# Patient Record
Sex: Female | Born: 1985 | Race: White | Hispanic: No | Marital: Married | Smoking: Former smoker
Health system: Southern US, Community
[De-identification: ages and names within clinical notes are randomized; demographics above are authoritative.]

## PROBLEM LIST (undated history)

## (undated) ENCOUNTER — Inpatient Hospital Stay (HOSPITAL_COMMUNITY): Payer: Self-pay

## (undated) DIAGNOSIS — Z789 Other specified health status: Secondary | ICD-10-CM

## (undated) DIAGNOSIS — N200 Calculus of kidney: Secondary | ICD-10-CM

## (undated) DIAGNOSIS — Z8619 Personal history of other infectious and parasitic diseases: Secondary | ICD-10-CM

## (undated) DIAGNOSIS — M419 Scoliosis, unspecified: Secondary | ICD-10-CM

## (undated) HISTORY — DX: Calculus of kidney: N20.0

## (undated) HISTORY — DX: Personal history of other infectious and parasitic diseases: Z86.19

---

## 2009-08-12 HISTORY — PX: CHOLECYSTECTOMY: SHX55

## 2009-11-02 ENCOUNTER — Inpatient Hospital Stay (HOSPITAL_COMMUNITY): Admission: AD | Admit: 2009-11-02 | Discharge: 2009-11-04 | Payer: Self-pay | Admitting: Obstetrics & Gynecology

## 2010-01-09 ENCOUNTER — Ambulatory Visit (HOSPITAL_COMMUNITY): Admission: EM | Admit: 2010-01-09 | Discharge: 2010-01-11 | Payer: Self-pay | Admitting: Emergency Medicine

## 2010-01-10 ENCOUNTER — Encounter (INDEPENDENT_AMBULATORY_CARE_PROVIDER_SITE_OTHER): Payer: Self-pay

## 2010-10-29 LAB — CBC
HCT: 36.4 % (ref 36.0–46.0)
HCT: 39.5 % (ref 36.0–46.0)
Hemoglobin: 11.4 g/dL — ABNORMAL LOW (ref 12.0–15.0)
MCHC: 35.2 g/dL (ref 30.0–36.0)
MCV: 91.6 fL (ref 78.0–100.0)
Platelets: 145 10*3/uL — ABNORMAL LOW (ref 150–400)
Platelets: 171 10*3/uL (ref 150–400)
RBC: 3.55 MIL/uL — ABNORMAL LOW (ref 3.87–5.11)
RBC: 3.96 MIL/uL (ref 3.87–5.11)
RDW: 12.7 % (ref 11.5–15.5)
RDW: 12.7 % (ref 11.5–15.5)
WBC: 6.5 10*3/uL (ref 4.0–10.5)
WBC: 10.5 10*3/uL (ref 4.0–10.5)

## 2010-10-29 LAB — DIFFERENTIAL
Basophils Absolute: 0 10*3/uL (ref 0.0–0.1)
Blasts: 0 %
Eosinophils Absolute: 0 10*3/uL (ref 0.0–0.7)
Eosinophils Relative: 0 % (ref 0–5)
Lymphs Abs: 2.2 10*3/uL (ref 0.7–4.0)
Monocytes Absolute: 0.4 10*3/uL (ref 0.1–1.0)
Monocytes Relative: 4 % (ref 3–12)
Neutro Abs: 7.9 10*3/uL — ABNORMAL HIGH (ref 1.7–7.7)
Neutrophils Relative %: 75 % (ref 43–77)
Promyelocytes Absolute: 0 %

## 2010-10-29 LAB — COMPREHENSIVE METABOLIC PANEL
ALT: 15 U/L (ref 0–35)
Alkaline Phosphatase: 60 U/L (ref 39–117)
Alkaline Phosphatase: 65 U/L (ref 39–117)
BUN: 4 mg/dL — ABNORMAL LOW (ref 6–23)
BUN: 4 mg/dL — ABNORMAL LOW (ref 6–23)
CO2: 26 mEq/L (ref 19–32)
Chloride: 109 mEq/L (ref 96–112)
Creatinine, Ser: 0.74 mg/dL (ref 0.4–1.2)
Creatinine, Ser: 0.88 mg/dL (ref 0.4–1.2)
Creatinine, Ser: 0.71 mg/dL (ref 0.4–1.2)
GFR calc Af Amer: >60 mL/min (ref >60)
Glucose, Bld: 105 mg/dL — ABNORMAL HIGH (ref 70–99)
Glucose, Bld: 109 mg/dL — ABNORMAL HIGH (ref 70–99)
Glucose, Bld: 108 mg/dL — ABNORMAL HIGH (ref 70–99)
Potassium: 3 mEq/L — ABNORMAL LOW (ref 3.5–5.1)
Total Bilirubin: 0.9 mg/dL (ref 0.3–1.2)
Total Protein: 7.2 g/dL (ref 6.0–8.3)

## 2010-10-29 LAB — URINALYSIS, ROUTINE W REFLEX MICROSCOPIC
Bilirubin Urine: NEGATIVE
Glucose, UA: NEGATIVE mg/dL
Hgb urine dipstick: NEGATIVE
Ketones, ur: NEGATIVE mg/dL
Nitrite: NEGATIVE
Urobilinogen, UA: 0.2 mg/dL (ref 0.0–1.0)

## 2010-11-05 LAB — CBC
HCT: 30.2 % — ABNORMAL LOW (ref 36.0–46.0)
HCT: 38.4 % (ref 36.0–46.0)
Hemoglobin: 10.6 g/dL — ABNORMAL LOW (ref 12.0–15.0)
MCV: 95.4 fL (ref 78.0–100.0)
Platelets: 162 10*3/uL (ref 150–400)
Platelets: 183 10*3/uL (ref 150–400)
RDW: 13.7 % (ref 11.5–15.5)
RDW: 13.8 % (ref 11.5–15.5)
WBC: 8.9 10*3/uL (ref 4.0–10.5)

## 2010-11-05 LAB — RPR: RPR Ser Ql: NONREACTIVE

## 2012-08-12 NOTE — L&D Delivery Note (Signed)
Patient was C/C/+3 and pushed for 10 minutes with epidural.   NSVD  female infant, Apgars 9,9, weight P.   The patient had one midline first degree lacerations repaired with 2-0 vicryl R. Fundus was firm. EBL was expected. Placenta was delivered intact. Vagina was clear.  Baby was vigorous to bedside.  Kelsey [name] A

## 2012-09-22 ENCOUNTER — Other Ambulatory Visit: Payer: Self-pay | Admitting: Obstetrics and Gynecology

## 2012-09-22 LAB — OB RESULTS CONSOLE GC/CHLAMYDIA
Chlamydia: NEGATIVE
Gonorrhea: NEGATIVE

## 2012-11-02 LAB — OB RESULTS CONSOLE RPR: RPR: NONREACTIVE

## 2012-11-02 LAB — OB RESULTS CONSOLE ANTIBODY SCREEN: Antibody Screen: NEGATIVE

## 2013-04-22 ENCOUNTER — Inpatient Hospital Stay (HOSPITAL_COMMUNITY)
Admission: AD | Admit: 2013-04-22 | Discharge: 2013-04-25 | DRG: 775 | Disposition: A | Discharge status: Home / Self Care | Payer: Managed Care, Other (non HMO) | Source: Ambulatory Visit | Attending: Obstetrics and Gynecology | Admitting: Obstetrics and Gynecology

## 2013-04-22 ENCOUNTER — Encounter (HOSPITAL_COMMUNITY): Payer: Self-pay | Admitting: *Deleted

## 2013-04-22 DIAGNOSIS — Z3480 Encounter for supervision of other normal pregnancy, unspecified trimester: Secondary | ICD-10-CM

## 2013-04-22 DIAGNOSIS — Z9089 Acquired absence of other organs: Secondary | ICD-10-CM

## 2013-04-22 HISTORY — DX: Other specified health status: Z78.9

## 2013-04-22 LAB — CBC
HCT: 32.8 % — ABNORMAL LOW (ref 36.0–46.0)
MCV: 89.9 fL (ref 78.0–100.0)
Platelets: 158 10*3/uL (ref 150–400)
RBC: 3.65 MIL/uL — ABNORMAL LOW (ref 3.87–5.11)
WBC: 8.5 10*3/uL (ref 4.0–10.5)

## 2013-04-22 LAB — POCT FERN TEST: POCT Fern Test: POSITIVE

## 2013-04-22 MED ORDER — OXYTOCIN 40 UNITS IN LACTATED RINGERS INFUSION - SIMPLE MED
62.5000 mL/h | INTRAVENOUS | Status: DC
  Filled 2013-04-22: qty 1000

## 2013-04-22 MED ORDER — DIPHENHYDRAMINE HCL 50 MG/ML IJ SOLN: 12.5000 mg | INTRAMUSCULAR | Status: DC | PRN

## 2013-04-22 MED ORDER — CITRIC ACID-SODIUM CITRATE 334-500 MG/5ML PO SOLN: 30.0000 mL | ORAL | Status: DC | PRN

## 2013-04-22 MED ORDER — IBUPROFEN 600 MG PO TABS: 600.0000 mg | ORAL_TABLET | Freq: Four times a day (QID) | ORAL | Status: DC | PRN

## 2013-04-22 MED ORDER — OXYCODONE-ACETAMINOPHEN 5-325 MG PO TABS: 1.0000 | ORAL_TABLET | ORAL | Status: DC | PRN

## 2013-04-22 MED ORDER — EPHEDRINE 5 MG/ML INJ
10.0000 mg | INTRAVENOUS | Status: DC | PRN
  Filled 2013-04-22: qty 2

## 2013-04-22 MED ORDER — EPHEDRINE 5 MG/ML INJ
10.0000 mg | INTRAVENOUS | Status: DC | PRN
  Filled 2013-04-22: qty 2
  Filled 2013-04-22: qty 4

## 2013-04-22 MED ORDER — FLEET ENEMA 7-19 GM/118ML RE ENEM: 1.0000 | ENEMA | RECTAL | Status: DC | PRN

## 2013-04-22 MED ORDER — PHENYLEPHRINE 40 MCG/ML (10ML) SYRINGE FOR IV PUSH (FOR BLOOD PRESSURE SUPPORT)
80.0000 ug | PREFILLED_SYRINGE | INTRAVENOUS | Status: DC | PRN
  Filled 2013-04-22: qty 2

## 2013-04-22 MED ORDER — LACTATED RINGERS IV SOLN: 500.0000 mL | INTRAVENOUS | Status: DC | PRN

## 2013-04-22 MED ORDER — LACTATED RINGERS IV SOLN
INTRAVENOUS | Status: DC
  Administered 2013-04-22: 21:00:00 via INTRAVENOUS

## 2013-04-22 MED ORDER — LIDOCAINE HCL (PF) 1 % IJ SOLN
30.0000 mL | INTRAMUSCULAR | Status: DC | PRN
  Filled 2013-04-22 (×2): qty 30

## 2013-04-22 MED ORDER — LACTATED RINGERS IV SOLN
500.0000 mL | Freq: Once | INTRAVENOUS | Status: AC
  Administered 2013-04-23: 500 mL via INTRAVENOUS

## 2013-04-22 MED ORDER — FENTANYL 2.5 MCG/ML BUPIVACAINE 1/10 % EPIDURAL INFUSION (WH - ANES)
14.0000 mL/h | INTRAMUSCULAR | Status: DC | PRN
  Filled 2013-04-22: qty 125

## 2013-04-22 MED ORDER — ACETAMINOPHEN 325 MG PO TABS: 650.0000 mg | ORAL_TABLET | ORAL | Status: DC | PRN

## 2013-04-22 MED ORDER — ONDANSETRON HCL 4 MG/2ML IJ SOLN: 4.0000 mg | Freq: Four times a day (QID) | INTRAMUSCULAR | Status: DC | PRN

## 2013-04-22 MED ORDER — PHENYLEPHRINE 40 MCG/ML (10ML) SYRINGE FOR IV PUSH (FOR BLOOD PRESSURE SUPPORT)
80.0000 ug | PREFILLED_SYRINGE | INTRAVENOUS | Status: DC | PRN
  Filled 2013-04-22: qty 5
  Filled 2013-04-22: qty 2

## 2013-04-22 MED ORDER — OXYTOCIN BOLUS FROM INFUSION
500.0000 mL | INTRAVENOUS | Status: DC
  Administered 2013-04-23: 500 mL via INTRAVENOUS

## 2013-04-22 MED ORDER — BUTORPHANOL TARTRATE 1 MG/ML IJ SOLN: 1.0000 mg | INTRAMUSCULAR | Status: DC | PRN

## 2013-04-22 NOTE — H&P (Signed)
27 y.o. [redacted]w[redacted]d  G3P1011 comes in c/o SROM at 1900.  Otherwise has good fetal movement and no bleeding.  Past Medical History  Diagnosis Date  . Medical history non-contributory     Past Surgical History  Procedure Laterality Date  . Cholecystectomy      OB History  Gravida Para Term Preterm AB SAB TAB Ectopic Multiple Living  3 1 1  1  1   1     # Outcome Date GA Lbr Len/2nd Weight Sex Delivery Anes PTL Lv  3 CUR           2 TRM      SVD   Y  1 TAB               History   Social History  . Marital Status: Married    Spouse Name: N/[name]    Number of Children: N/[name]  . Years of Education: N/[name]   Occupational History  . Not on file.   Social History Main Topics  . Smoking status: Former Smoker    Types: Cigarettes    Quit date: 08/13/2000  . Smokeless tobacco: Not on file  . Alcohol Use: No  . Drug Use: No  . Sexual Activity: Yes   Other Topics Concern  . Not on file   Social History Narrative  . No narrative on file   Review of patient's allergies indicates no known allergies.    Prenatal Transfer Tool  Maternal Diabetes: No Genetic Screening: Normal Maternal Ultrasounds/Referrals: Normal Fetal Ultrasounds or other Referrals:  None Maternal Substance Abuse:  No Significant Maternal Medications:  None Significant Maternal Lab Results: None  Other PNC:    Filed Vitals:   04/22/13 1956  BP: 124/86  Pulse: 86  Temp: 98.3 F (36.8 C)  Resp: 18     Lungs/Cor:  NAD Abdomen:  soft, gravid Ex:  no cords, erythema SVE:  4/90/-2, grossly ruptured per nurse FHTs:  120, good STV, NST R Toco:  q5   [name]/P   Term, SROM, labor.  GBS neg.  Kelsey [name]

## 2013-04-22 NOTE — MAU Note (Signed)
PT SAYS AT 7PM  SHE WAS  SQUATTING - CLEANING-  AND SHE FELT A GUSH-  CLEAR FLUID.   VE IN OFFICE  4 CM  YESTERDAY.    GBS- NEG..  DENIES HSV AND MRSA.

## 2013-04-23 ENCOUNTER — Encounter (HOSPITAL_COMMUNITY): Payer: Self-pay | Admitting: Anesthesiology

## 2013-04-23 ENCOUNTER — Inpatient Hospital Stay (HOSPITAL_COMMUNITY): Payer: Managed Care, Other (non HMO) | Admitting: Anesthesiology

## 2013-04-23 LAB — CBC
HCT: 31 % — ABNORMAL LOW (ref 36.0–46.0)
Hemoglobin: 10.8 g/dL — ABNORMAL LOW (ref 12.0–15.0)
MCH: 31.5 pg (ref 26.0–34.0)
MCHC: 34.8 g/dL (ref 30.0–36.0)

## 2013-04-23 MED ORDER — SIMETHICONE 80 MG PO CHEW: 80.0000 mg | CHEWABLE_TABLET | ORAL | Status: DC | PRN

## 2013-04-23 MED ORDER — MAGNESIUM HYDROXIDE 400 MG/5ML PO SUSP: 30.0000 mL | ORAL | Status: DC | PRN

## 2013-04-23 MED ORDER — BENZOCAINE-MENTHOL 20-0.5 % EX AERO: 1.0000 "application " | INHALATION_SPRAY | CUTANEOUS | Status: DC | PRN

## 2013-04-23 MED ORDER — OXYCODONE-ACETAMINOPHEN 5-325 MG PO TABS
1.0000 | ORAL_TABLET | ORAL | Status: DC | PRN
  Administered 2013-04-23 (×2): 1 via ORAL
  Filled 2013-04-23 (×2): qty 1

## 2013-04-23 MED ORDER — SODIUM CHLORIDE 0.9 % IV SOLN: 250.0000 mL | INTRAVENOUS | Status: DC | PRN

## 2013-04-23 MED ORDER — WITCH HAZEL-GLYCERIN EX PADS: 1.0000 "application " | MEDICATED_PAD | CUTANEOUS | Status: DC | PRN

## 2013-04-23 MED ORDER — IBUPROFEN 800 MG PO TABS
800.0000 mg | ORAL_TABLET | Freq: Three times a day (TID) | ORAL | Status: DC
  Administered 2013-04-23 – 2013-04-25 (×7): 800 mg via ORAL
  Filled 2013-04-23 (×7): qty 1

## 2013-04-23 MED ORDER — SODIUM CHLORIDE 0.9 % IJ SOLN
3.0000 mL | Freq: Two times a day (BID) | INTRAMUSCULAR | Status: DC
  Administered 2013-04-23: 3 mL via INTRAVENOUS

## 2013-04-23 MED ORDER — METHYLERGONOVINE MALEATE 0.2 MG/ML IJ SOLN: 0.2000 mg | INTRAMUSCULAR | Status: DC | PRN

## 2013-04-23 MED ORDER — DIBUCAINE 1 % RE OINT: 1.0000 "application " | TOPICAL_OINTMENT | RECTAL | Status: DC | PRN

## 2013-04-23 MED ORDER — TETANUS-DIPHTH-ACELL PERTUSSIS 5-2.5-18.5 LF-MCG/0.5 IM SUSP
0.5000 mL | Freq: Once | INTRAMUSCULAR | Status: AC
  Administered 2013-04-24: 0.5 mL via INTRAMUSCULAR
  Filled 2013-04-23: qty 0.5

## 2013-04-23 MED ORDER — METHYLERGONOVINE MALEATE 0.2 MG PO TABS: 0.2000 mg | ORAL_TABLET | ORAL | Status: DC | PRN

## 2013-04-23 MED ORDER — SODIUM CHLORIDE 0.9 % IJ SOLN: 3.0000 mL | INTRAMUSCULAR | Status: DC | PRN

## 2013-04-23 MED ORDER — LIDOCAINE HCL (PF) 1 % IJ SOLN
INTRAMUSCULAR | Status: DC | PRN
  Administered 2013-04-23 (×2): 9 mL

## 2013-04-23 MED ORDER — ONDANSETRON HCL 4 MG/2ML IJ SOLN: 4.0000 mg | INTRAMUSCULAR | Status: DC | PRN

## 2013-04-23 MED ORDER — MEASLES, MUMPS & RUBELLA VAC ~~LOC~~ INJ: 0.5000 mL | INJECTION | Freq: Once | SUBCUTANEOUS | Status: DC

## 2013-04-23 MED ORDER — FERROUS SULFATE 325 (65 FE) MG PO TABS
325.0000 mg | ORAL_TABLET | Freq: Two times a day (BID) | ORAL | Status: DC
  Administered 2013-04-23 – 2013-04-25 (×5): 325 mg via ORAL
  Filled 2013-04-23 (×5): qty 1

## 2013-04-23 MED ORDER — PRENATAL MULTIVITAMIN CH
1.0000 | ORAL_TABLET | Freq: Every day | ORAL | Status: DC
  Administered 2013-04-23 – 2013-04-25 (×3): 1 via ORAL
  Filled 2013-04-23 (×3): qty 1

## 2013-04-23 MED ORDER — LANOLIN HYDROUS EX OINT: TOPICAL_OINTMENT | CUTANEOUS | Status: DC | PRN

## 2013-04-23 MED ORDER — SENNOSIDES-DOCUSATE SODIUM 8.6-50 MG PO TABS: 2.0000 | ORAL_TABLET | Freq: Every day | ORAL | Status: DC

## 2013-04-23 MED ORDER — ONDANSETRON HCL 4 MG PO TABS: 4.0000 mg | ORAL_TABLET | ORAL | Status: DC | PRN

## 2013-04-23 MED ORDER — ZOLPIDEM TARTRATE 5 MG PO TABS: 5.0000 mg | ORAL_TABLET | Freq: Every evening | ORAL | Status: DC | PRN

## 2013-04-23 MED ORDER — FENTANYL 2.5 MCG/ML BUPIVACAINE 1/10 % EPIDURAL INFUSION (WH - ANES)
INTRAMUSCULAR | Status: DC | PRN
  Administered 2013-04-23: 14 mL/h via EPIDURAL

## 2013-04-23 MED ORDER — DIPHENHYDRAMINE HCL 25 MG PO CAPS: 25.0000 mg | ORAL_CAPSULE | Freq: Four times a day (QID) | ORAL | Status: DC | PRN

## 2013-04-23 NOTE — Anesthesia Preprocedure Evaluation (Signed)
Anesthesia Evaluation  Patient identified by MRN, date of birth, ID band Patient awake    Reviewed: Allergy & Precautions, H&P , NPO status , Patient's Chart, lab work & pertinent test results  Airway Mallampati: I TM Distance: >3 FB Neck ROM: full    Dental no notable dental hx.    Pulmonary neg pulmonary ROS,    Pulmonary exam normal       Cardiovascular negative cardio ROS      Neuro/Psych negative neurological ROS  negative psych ROS   GI/Hepatic negative GI ROS, Neg liver ROS,   Endo/Other  negative endocrine ROS  Renal/GU negative Renal ROS  negative genitourinary   Musculoskeletal negative musculoskeletal ROS (+)   Abdominal Normal abdominal exam  (+)   Peds  Hematology negative hematology ROS (+)   Anesthesia Other Findings   Reproductive/Obstetrics (+) Pregnancy                           Anesthesia Physical  Anesthesia Plan  ASA: II  Anesthesia Plan: Epidural   Post-op Pain Management:    Induction:   Airway Management Planned:   Additional Equipment:   Intra-op Plan:   Post-operative Plan:   Informed Consent: I have reviewed the patients History and Physical, chart, labs and discussed the procedure including the risks, benefits and alternatives for the proposed anesthesia with the patient or authorized representative who has indicated his/her understanding and acceptance.     Plan Discussed with:   Anesthesia Plan Comments:         Anesthesia Quick Evaluation  

## 2013-04-23 NOTE — Anesthesia Procedure Notes (Signed)
Epidural Patient location during procedure: OB Start time: 04/23/2013 1:01 AM End time: 04/23/2013 1:05 AM  Staffing Anesthesiologist: Sandrea Hughs Performed by: anesthesiologist   Preanesthetic Checklist Completed: patient identified, surgical consent, pre-op evaluation, timeout performed, IV checked, risks and benefits discussed and monitors and equipment checked  Epidural Patient position: sitting Prep: site prepped and draped and DuraPrep Patient monitoring: continuous pulse ox and blood pressure Approach: midline Injection technique: LOR air  Needle:  Needle type: Tuohy  Needle gauge: 17 G Needle length: 9 cm and 9 Needle insertion depth: 5 cm cm Catheter type: closed end flexible Catheter size: 19 Gauge Catheter at skin depth: 10 cm Test dose: negative and Other  Assessment Sensory level: T9 Events: blood not aspirated, injection not painful, no injection resistance, negative IV test and no paresthesia  Additional Notes Reason for block:procedure for pain

## 2013-04-23 NOTE — Progress Notes (Signed)
Dr. Henderson Cloud called, updated on pt status, SVE, and uc pattern, orders received if 2 hours following  Epidural placement still no cervical change then begin Pitocin at 2 mu/min and increase by 2 mu.min

## 2013-04-23 NOTE — Anesthesia Postprocedure Evaluation (Signed)
  Anesthesia Post-op Note  Patient: Kelsey [name]  Procedure(s) Performed: * No procedures listed *  Patient Location: Mother/Baby  Anesthesia Type:Epidural  Level of Consciousness: awake and alert   Airway and Oxygen Therapy: Patient Spontanous Breathing  Post-op Pain: mild  Post-op Assessment: Patient's Cardiovascular Status Stable, Respiratory Function Stable, No signs of Nausea or vomiting, Pain level controlled, No headache, No residual numbness and No residual motor weakness  Post-op Vital Signs: stable  Complications: No apparent anesthesia complications

## 2013-04-24 NOTE — Progress Notes (Signed)
PPD#1 Pt without complaints. VSSAF IMP/ doing well  Plan/ routine care. 

## 2013-04-24 NOTE — Lactation Note (Signed)
This note was copied from the chart of Kelsey [name]. Lactation Consultation Note Mom states breast feeding is going very well, denies breast pain, states baby latches very well to both sides. Mom states that she and baby are both doing fine with breast feeding.  Enc mom to call for assistance if she has any concerns. Mom states she does have the phone number for lactation office and will call if needed.  Follow up inpatient PRN.  Patient Name: Kelsey [name] [id number] Date: 04/24/2013     Maternal Data    Feeding Length of feed: 10 min  LATCH Score/Interventions Latch: Repeated attempts needed to sustain latch, nipple held in mouth throughout feeding, stimulation needed to elicit sucking reflex.  Audible Swallowing: A few with stimulation  Type of Nipple: Everted at rest and after stimulation  Comfort (Breast/Nipple): Soft / non-tender     Hold (Positioning): No assistance needed to correctly position infant at breast.  LATCH Score: 8  Lactation Tools Discussed/Used     Consult Status      Lenard Forth 04/24/2013, 3:29 PM

## 2013-04-25 NOTE — Discharge Summary (Signed)
Obstetric Discharge Summary Reason for Admission: onset of labor Prenatal Procedures: ultrasound Intrapartum Procedures: spontaneous vaginal delivery Postpartum Procedures: none Complications-Operative and Postpartum: 1 degree perineal laceration Hemoglobin  Date Value Range Status  04/23/2013 10.8* 12.0 - 15.0 g/dL Final     HCT  Date Value Range Status  04/23/2013 31.0* 36.0 - 46.0 % Final    Physical Exam:  General: alert Lochia: appropriate Uterine Fundus: firm  Discharge Diagnoses: Term Pregnancy-delivered  Discharge Information: Date: 04/25/2013 Activity: pelvic rest Diet: routine Medications: PNV and Ibuprofen Condition: stable Instructions: refer to practice specific booklet Discharge to: home Follow-up Information   Follow up with Levi Aland, MD. Schedule an appointment as soon as possible for a visit in 4 weeks.   Specialty:  Obstetrics and Gynecology   Contact information:   [address] [place] [address] [phone]       Newborn Data: Live born female  Birth Weight: 7 lb 4.1 oz (3290 g) APGAR: 9, 9  Home with mother.  ANDERSON,MARK E 04/25/2013, 9:47 AM

## 2013-04-25 NOTE — Progress Notes (Signed)
Pt without complaints. Ready to go home. PLAN/ Will discharge.

## 2014-03-08 ENCOUNTER — Inpatient Hospital Stay (HOSPITAL_COMMUNITY): Payer: Medicaid Other

## 2014-03-08 ENCOUNTER — Inpatient Hospital Stay (HOSPITAL_COMMUNITY)
Admission: AD | Admit: 2014-03-08 | Discharge: 2014-03-08 | Disposition: A | Discharge status: Home / Self Care | Payer: Medicaid Other | Source: Ambulatory Visit | Attending: Obstetrics and Gynecology | Admitting: Obstetrics and Gynecology

## 2014-03-08 ENCOUNTER — Encounter (HOSPITAL_COMMUNITY): Payer: Self-pay

## 2014-03-08 DIAGNOSIS — R109 Unspecified abdominal pain: Secondary | ICD-10-CM | POA: Diagnosis not present

## 2014-03-08 DIAGNOSIS — O209 Hemorrhage in early pregnancy, unspecified: Secondary | ICD-10-CM | POA: Insufficient documentation

## 2014-03-08 DIAGNOSIS — Z87891 Personal history of nicotine dependence: Secondary | ICD-10-CM | POA: Insufficient documentation

## 2014-03-08 DIAGNOSIS — O4690 Antepartum hemorrhage, unspecified, unspecified trimester: Secondary | ICD-10-CM

## 2014-03-08 LAB — CBC
HEMATOCRIT: 38.3 % (ref 36.0–46.0)
HEMOGLOBIN: 13.6 g/dL (ref 12.0–15.0)
MCH: 32.2 pg (ref 26.0–34.0)
MCHC: 35.5 g/dL (ref 30.0–36.0)
MCV: 90.8 fL (ref 78.0–100.0)
Platelets: 226 10*3/uL (ref 150–400)
RBC: 4.22 MIL/uL (ref 3.87–5.11)
RDW: 12.2 % (ref 11.5–15.5)
WBC: 10.3 10*3/uL (ref 4.0–10.5)

## 2014-03-08 LAB — HCG, QUANTITATIVE, PREGNANCY: HCG, BETA CHAIN, QUANT, S: 8530 m[IU]/mL — AB (ref <5)

## 2014-03-08 NOTE — MAU Note (Signed)
Pt presenting with heavier bleeding after having some spotting 3 days ago and then bleeding becoming progressively heavier. Pt changing a pad every hour for today.

## 2014-03-08 NOTE — MAU Note (Signed)
Patient states she has had a positive home pregnancy test. Started spotting 4 days ago after intercourse but has continued and is heavier. Patient has blood on outer clothing and in the bathroom is bleeding moderately dark red and passing clots. Assisted with clean up and clothing change.

## 2014-03-08 NOTE — MAU Provider Note (Signed)
History     CSN: [id number]  Arrival date and time: 03/08/14 1740   First Provider Initiated Contact with Patient 03/08/14 1843      Chief Complaint  Patient presents with  . Possible Pregnancy  . Vaginal Bleeding   HPI Pt is 1268w5d pregnant [id number] who presents with heavy bleeding and cramping since 4 pm.  Pt has passed large clots. Pt has a 5710 month old delivered by Dr. Henderson CloudHorvath RN note: Collison, RN Registered Nurse Signed  MAU Note Service date: 03/08/2014 6:33 PM   Pt presenting with heavier bleeding after having some spotting 3 days ago and then bleeding becoming progressively heavier. Pt changing a pad every hour for today  Patient states she has had a positive home pregnancy test. Started spotting 4 days ago after intercourse but has continued and is heavier. Patient has blood on outer clothing and in the bathroom is bleeding moderately dark red and passing clots. Assisted with clean up and clothing change   Past Medical History  Diagnosis Date  . Medical history non-contributory     Past Surgical History  Procedure Laterality Date  . Cholecystectomy      History reviewed. No pertinent family history.  History  Substance Use Topics  . Smoking status: Former Smoker    Types: Cigarettes    Quit date: 08/13/2000  . Smokeless tobacco: Not on file  . Alcohol Use: No    Allergies: No Known Allergies  No prescriptions prior to admission    Review of Systems  Constitutional: Negative for fever and chills.  Gastrointestinal: Positive for abdominal pain.  Genitourinary: Negative for dysuria.   Physical Exam   Blood pressure 139/84, pulse 102, temperature 99.8 F (37.7 C), temperature source Oral, resp. rate 16, height 4' 11.5" (1.511 m), weight 156 lb 6.4 oz (70.943 kg), last menstrual period 12/23/2013, SpO2 98.00%, unknown if currently breastfeeding.  Physical Exam  Nursing note and vitals reviewed. Constitutional: She is oriented to person, place, and  time. She appears well-developed and well-nourished.  tearful  HENT:  Head: Normocephalic.  Eyes: Pupils are equal, round, and reactive to light.  Neck: Normal range of motion. Neck supple.  Cardiovascular: Normal rate.   Respiratory: Effort normal.  GI: Soft. She exhibits no distension. There is no tenderness. There is no rebound.  Genitourinary:  Mod amount of dark red blood in vault; no clots or tissue noted; cervix ?FT; uterus and adnexa without palpable tenderness  Musculoskeletal: Normal range of motion.  Neurological: She is alert and oriented to person, place, and time.  Skin: Skin is warm and dry.  Psychiatric: She has a normal mood and affect.    MAU Course  Procedures Dr. Henderson CloudHorvath came to see pt and review ultrasound [place] Ob Comp Less 14 Wks  03/08/2014   CLINICAL DATA:  Patient with vaginal bleeding.  EXAM: OBSTETRIC <14 WK US AND TRANSVAGINAL OB US  TECHNIQUE: Both transabdominal and transvaginal ultrasound examinations were performed for complete evaluation of the gestation as well as the maternal uterus, adnexal regions, and pelvic cul-de-sac. Transvaginal technique was performed to assess early pregnancy.  COMPARISON:  None.  FINDINGS: Intrauterine gestational sac: Visualized/normal in shape.  Yolk sac:  Present  Embryo:  Present  Cardiac Activity: Present  Heart Rate:  115 bpm  CRL:   7.3  mm   6 w 5 d                  US EDC: 10/27/2014  Maternal uterus/adnexae: The  ovaries are unremarkable. Probable right ovarian corpus luteal cyst.  IMPRESSION: Single live intrauterine gestation 6 weeks 5 days by crown-rump length.   Electronically Signed   By: Annia Belt M.D.   On: 03/08/2014 19:47   US Ob Transvaginal  03/08/2014   CLINICAL DATA:  Patient with vaginal bleeding.  EXAM: OBSTETRIC <14 WK [place] AND TRANSVAGINAL OB US  TECHNIQUE: Both transabdominal and transvaginal ultrasound examinations were performed for complete evaluation of the gestation as well as the maternal uterus,  adnexal regions, and pelvic cul-de-sac. Transvaginal technique was performed to assess early pregnancy.  COMPARISON:  None.  FINDINGS: Intrauterine gestational sac: Visualized/normal in shape.  Yolk sac:  Present  Embryo:  Present  Cardiac Activity: Present  Heart Rate:  115 bpm  CRL:   7.3  mm   6 w 5 d                  [place] EDC: 10/27/2014  Maternal uterus/adnexae: The ovaries are unremarkable. Probable right ovarian corpus luteal cyst.  IMPRESSION: Single live intrauterine gestation 6 weeks 5 days by crown-rump length.   Electronically Signed   By: Annia Belt M.D.   On: 03/08/2014 19:47   Results for orders placed during the hospital encounter of 03/08/14 (from the past 24 hour(s))  CBC     Status: None   Collection Time    03/08/14  6:36 PM      Result Value Ref Range   WBC 10.3  4.0 - 10.5 K/uL   RBC 4.22  3.87 - 5.11 MIL/uL   Hemoglobin 13.6  12.0 - 15.0 g/dL   HCT 16.1  09.6 - 04.5 %   MCV 90.8  78.0 - 100.0 fL   MCH 32.2  26.0 - 34.0 pg   MCHC 35.5  30.0 - 36.0 g/dL   RDW 40.9  81.1 - 91.4 %   Platelets 226  150 - 400 K/uL  HCG, QUANTITATIVE, PREGNANCY     Status: Abnormal   Collection Time    03/08/14  6:36 PM      Result Value Ref Range   hCG, Beta Chain, Quant, S 8530 (*) <5 mIU/mL    Assessment and Plan  Bleeding in pregnancy D/c home and follow up in one week at office for ultrasound  Kelsey [name],Kelsey [name] 03/08/2014, 7:01 PM

## 2014-06-13 ENCOUNTER — Encounter (HOSPITAL_COMMUNITY): Payer: Self-pay

## 2014-08-12 NOTE — L&D Delivery Note (Addendum)
Delivery Note At 3:01 PM a viable female, "Kelsey [name]", was delivered via Vaginal, Spontaneous Delivery (Presentation: Right Occiput Anterior).  APGAR: 9, 9; weight  .   Placenta status: Intact, Spontaneous.  Cord: 3 vessels with the following complications: CAN x 2, reduced over body at delivery.  Cord pH: NA  Anesthesia: Epidural  Episiotomy: None Lacerations: None--scattered perineal abrasions, none requiring repair. Suture Repair: None Est. Blood Loss (mL): 15 cc  Mom to postpartum.  Baby to Couplet care / Skin to Skin. Patient desires PP BTL. Will make NPO after MN, maintain epidural and IV access. Dr. Richardson Dopp notified of patient request for PP BTL. Consent signed 02/15/15, placed on shadow chart.  Kelsey [name] 05/13/2015, 3:26 PM

## 2014-12-22 LAB — OB RESULTS CONSOLE HIV ANTIBODY (ROUTINE TESTING): HIV: NONREACTIVE

## 2014-12-22 LAB — OB RESULTS CONSOLE RPR: RPR: NONREACTIVE

## 2015-01-11 ENCOUNTER — Encounter (HOSPITAL_COMMUNITY): Payer: Self-pay | Admitting: *Deleted

## 2015-02-22 LAB — OB RESULTS CONSOLE RPR: RPR: NONREACTIVE

## 2015-02-22 LAB — OB RESULTS CONSOLE HIV ANTIBODY (ROUTINE TESTING): HIV: NONREACTIVE

## 2015-04-13 LAB — OB RESULTS CONSOLE GBS: GBS: NEGATIVE

## 2015-05-13 ENCOUNTER — Inpatient Hospital Stay (HOSPITAL_COMMUNITY): Payer: Medicaid Other | Admitting: Anesthesiology

## 2015-05-13 ENCOUNTER — Inpatient Hospital Stay (HOSPITAL_COMMUNITY)
Admission: AD | Admit: 2015-05-13 | Discharge: 2015-05-15 | DRG: 767 | Disposition: A | Discharge status: Home / Self Care | Payer: Medicaid Other | Source: Ambulatory Visit | Attending: Obstetrics and Gynecology | Admitting: Obstetrics and Gynecology

## 2015-05-13 ENCOUNTER — Encounter (HOSPITAL_COMMUNITY): Payer: Self-pay | Admitting: *Deleted

## 2015-05-13 DIAGNOSIS — K219 Gastro-esophageal reflux disease without esophagitis: Secondary | ICD-10-CM | POA: Diagnosis present

## 2015-05-13 DIAGNOSIS — O480 Post-term pregnancy: Principal | ICD-10-CM | POA: Diagnosis present

## 2015-05-13 DIAGNOSIS — O9962 Diseases of the digestive system complicating childbirth: Secondary | ICD-10-CM | POA: Diagnosis present

## 2015-05-13 DIAGNOSIS — O99214 Obesity complicating childbirth: Secondary | ICD-10-CM | POA: Diagnosis present

## 2015-05-13 DIAGNOSIS — O0930 Supervision of pregnancy with insufficient antenatal care, unspecified trimester: Secondary | ICD-10-CM | POA: Diagnosis not present

## 2015-05-13 DIAGNOSIS — Z302 Encounter for sterilization: Secondary | ICD-10-CM | POA: Diagnosis not present

## 2015-05-13 DIAGNOSIS — Z3A40 40 weeks gestation of pregnancy: Secondary | ICD-10-CM | POA: Diagnosis not present

## 2015-05-13 DIAGNOSIS — E669 Obesity, unspecified: Secondary | ICD-10-CM | POA: Diagnosis present

## 2015-05-13 DIAGNOSIS — Z818 Family history of other mental and behavioral disorders: Secondary | ICD-10-CM | POA: Diagnosis not present

## 2015-05-13 DIAGNOSIS — Z87891 Personal history of nicotine dependence: Secondary | ICD-10-CM

## 2015-05-13 DIAGNOSIS — Z85850 Personal history of malignant neoplasm of thyroid: Secondary | ICD-10-CM | POA: Diagnosis not present

## 2015-05-13 DIAGNOSIS — Z6831 Body mass index (BMI) 31.0-31.9, adult: Secondary | ICD-10-CM | POA: Diagnosis not present

## 2015-05-13 DIAGNOSIS — Z820 Family history of epilepsy and other diseases of the nervous system: Secondary | ICD-10-CM | POA: Diagnosis not present

## 2015-05-13 DIAGNOSIS — O9902 Anemia complicating childbirth: Secondary | ICD-10-CM | POA: Diagnosis present

## 2015-05-13 HISTORY — DX: Scoliosis, unspecified: M41.9

## 2015-05-13 LAB — TYPE AND SCREEN
ABO/RH(D): A POS
ANTIBODY SCREEN: NEGATIVE

## 2015-05-13 LAB — CBC
HEMATOCRIT: 32.6 % — AB (ref 36.0–46.0)
HEMOGLOBIN: 11.2 g/dL — AB (ref 12.0–15.0)
MCH: 31.3 pg (ref 26.0–34.0)
MCHC: 34.4 g/dL (ref 30.0–36.0)
MCV: 91.1 fL (ref 78.0–100.0)
Platelets: 154 10*3/uL (ref 150–400)
RBC: 3.58 MIL/uL — ABNORMAL LOW (ref 3.87–5.11)
RDW: 13.7 % (ref 11.5–15.5)
WBC: 7.1 10*3/uL (ref 4.0–10.5)

## 2015-05-13 LAB — RPR: RPR Ser Ql: NONREACTIVE

## 2015-05-13 LAB — ABO/RH: ABO/RH(D): A POS

## 2015-05-13 MED ORDER — OXYCODONE-ACETAMINOPHEN 5-325 MG PO TABS: 2.0000 | ORAL_TABLET | ORAL | Status: DC | PRN

## 2015-05-13 MED ORDER — EPHEDRINE 5 MG/ML INJ: 10.0000 mg | INTRAVENOUS | Status: DC | PRN

## 2015-05-13 MED ORDER — FENTANYL 2.5 MCG/ML BUPIVACAINE 1/10 % EPIDURAL INFUSION (WH - ANES)
14.0000 mL/h | INTRAMUSCULAR | Status: DC | PRN
  Administered 2015-05-13: 14 mL/h via EPIDURAL
  Administered 2015-05-13: 11 mL/h via EPIDURAL
  Filled 2015-05-13: qty 125

## 2015-05-13 MED ORDER — OXYCODONE-ACETAMINOPHEN 5-325 MG PO TABS
1.0000 | ORAL_TABLET | ORAL | Status: DC | PRN
  Administered 2015-05-15 (×3): 1 via ORAL
  Filled 2015-05-13 (×3): qty 1

## 2015-05-13 MED ORDER — OXYTOCIN BOLUS FROM INFUSION
500.0000 mL | INTRAVENOUS | Status: DC
  Administered 2015-05-13: 500 mL via INTRAVENOUS

## 2015-05-13 MED ORDER — CITRIC ACID-SODIUM CITRATE 334-500 MG/5ML PO SOLN: 30.0000 mL | ORAL | Status: DC | PRN

## 2015-05-13 MED ORDER — ACETAMINOPHEN 325 MG PO TABS: 650.0000 mg | ORAL_TABLET | ORAL | Status: DC | PRN

## 2015-05-13 MED ORDER — OXYTOCIN 40 UNITS IN LACTATED RINGERS INFUSION - SIMPLE MED
62.5000 mL/h | INTRAVENOUS | Status: DC
  Filled 2015-05-13: qty 1000

## 2015-05-13 MED ORDER — ONDANSETRON HCL 4 MG/2ML IJ SOLN: 4.0000 mg | INTRAMUSCULAR | Status: DC | PRN

## 2015-05-13 MED ORDER — OXYCODONE-ACETAMINOPHEN 5-325 MG PO TABS: 1.0000 | ORAL_TABLET | ORAL | Status: DC | PRN

## 2015-05-13 MED ORDER — DIPHENHYDRAMINE HCL 25 MG PO CAPS: 25.0000 mg | ORAL_CAPSULE | Freq: Four times a day (QID) | ORAL | Status: DC | PRN

## 2015-05-13 MED ORDER — SIMETHICONE 80 MG PO CHEW
80.0000 mg | CHEWABLE_TABLET | ORAL | Status: DC | PRN
  Administered 2015-05-14 – 2015-05-15 (×2): 80 mg via ORAL
  Filled 2015-05-13: qty 1

## 2015-05-13 MED ORDER — LIDOCAINE HCL (PF) 1 % IJ SOLN
30.0000 mL | INTRAMUSCULAR | Status: DC | PRN
  Filled 2015-05-13: qty 30

## 2015-05-13 MED ORDER — ZOLPIDEM TARTRATE 5 MG PO TABS: 5.0000 mg | ORAL_TABLET | Freq: Every evening | ORAL | Status: DC | PRN

## 2015-05-13 MED ORDER — ONDANSETRON HCL 4 MG/2ML IJ SOLN: 4.0000 mg | Freq: Four times a day (QID) | INTRAMUSCULAR | Status: DC | PRN

## 2015-05-13 MED ORDER — TETANUS-DIPHTH-ACELL PERTUSSIS 5-2.5-18.5 LF-MCG/0.5 IM SUSP: 0.5000 mL | Freq: Once | INTRAMUSCULAR | Status: DC

## 2015-05-13 MED ORDER — PRENATAL MULTIVITAMIN CH
1.0000 | ORAL_TABLET | Freq: Every day | ORAL | Status: DC
  Administered 2015-05-14 – 2015-05-15 (×2): 1 via ORAL
  Filled 2015-05-13 (×2): qty 1

## 2015-05-13 MED ORDER — ONDANSETRON HCL 4 MG PO TABS: 4.0000 mg | ORAL_TABLET | ORAL | Status: DC | PRN

## 2015-05-13 MED ORDER — SENNOSIDES-DOCUSATE SODIUM 8.6-50 MG PO TABS
2.0000 | ORAL_TABLET | ORAL | Status: DC
  Administered 2015-05-13 – 2015-05-14 (×2): 2 via ORAL
  Filled 2015-05-13: qty 2

## 2015-05-13 MED ORDER — LANOLIN HYDROUS EX OINT
TOPICAL_OINTMENT | CUTANEOUS | Status: DC | PRN
Start: 2015-05-13 — End: 2015-05-15

## 2015-05-13 MED ORDER — IBUPROFEN 600 MG PO TABS
600.0000 mg | ORAL_TABLET | Freq: Four times a day (QID) | ORAL | Status: DC
  Administered 2015-05-13 – 2015-05-15 (×8): 600 mg via ORAL
  Filled 2015-05-13 (×8): qty 1

## 2015-05-13 MED ORDER — LACTATED RINGERS IV SOLN
INTRAVENOUS | Status: DC
  Administered 2015-05-13 (×2): via INTRAVENOUS

## 2015-05-13 MED ORDER — PHENYLEPHRINE 40 MCG/ML (10ML) SYRINGE FOR IV PUSH (FOR BLOOD PRESSURE SUPPORT)
80.0000 ug | PREFILLED_SYRINGE | INTRAVENOUS | Status: DC | PRN
  Filled 2015-05-13: qty 20

## 2015-05-13 MED ORDER — DIPHENHYDRAMINE HCL 50 MG/ML IJ SOLN: 12.5000 mg | INTRAMUSCULAR | Status: DC | PRN

## 2015-05-13 MED ORDER — FLEET ENEMA 7-19 GM/118ML RE ENEM: 1.0000 | ENEMA | RECTAL | Status: DC | PRN

## 2015-05-13 MED ORDER — LIDOCAINE HCL (PF) 1 % IJ SOLN
INTRAMUSCULAR | Status: DC | PRN
  Administered 2015-05-13: 3 mL via EPIDURAL
  Administered 2015-05-13: 4 mL via EPIDURAL

## 2015-05-13 MED ORDER — LACTATED RINGERS IV SOLN
500.0000 mL | INTRAVENOUS | Status: DC | PRN
  Administered 2015-05-13: 500 mL via INTRAVENOUS

## 2015-05-13 MED ORDER — WITCH HAZEL-GLYCERIN EX PADS: 1.0000 "application " | MEDICATED_PAD | CUTANEOUS | Status: DC | PRN

## 2015-05-13 MED ORDER — BENZOCAINE-MENTHOL 20-0.5 % EX AERO: 1.0000 "application " | INHALATION_SPRAY | CUTANEOUS | Status: DC | PRN

## 2015-05-13 MED ORDER — DIBUCAINE 1 % RE OINT: 1.0000 "application " | TOPICAL_OINTMENT | RECTAL | Status: DC | PRN

## 2015-05-13 NOTE — Progress Notes (Signed)
  Subjective: Comfortable with epidural.  Noted she has signed consent PP BTL on 02/15/15.  Objective: BP 129/86 mmHg  Pulse 81  Temp(Src) 97.7 F (36.5 C) (Oral)  Resp 18  Ht  (1.499 m)  Wt 70.035 kg (154 lb 6.4 oz)  BMI 31.17 kg/m2       FHT: Category 1 UC:   regular, every 3 minutes SVE:   Dilation: 6 Effacement (%): 90 Station: -1 Exam by:: Manfred Arch, CNM   Assessment:  Progressive labor Desires BTL  Plan: Await increased pressure. BTL consent placed on shadow chart.  Nigel Bridgeman CNM 05/13/2015, 1:07 PM

## 2015-05-13 NOTE — Anesthesia Procedure Notes (Signed)
Epidural Patient location during procedure: OB Start time: 05/13/2015 11:46 AM  Staffing Anesthesiologist: Mal Amabile Performed by: anesthesiologist   Preanesthetic Checklist Completed: patient identified, site marked, surgical consent, pre-op evaluation, timeout performed, IV checked, risks and benefits discussed and monitors and equipment checked  Epidural Patient position: sitting Prep: site prepped and draped and DuraPrep Patient monitoring: continuous pulse ox and blood pressure Approach: midline Location: L3-L4 Injection technique: LOR air  Needle:  Needle type: Tuohy  Needle gauge: 17 G Needle length: 9 cm and 9 Needle insertion depth: 4 cm Catheter type: closed end flexible Catheter size: 19 Gauge Catheter at skin depth: 9 cm Test dose: negative and Other  Assessment Events: blood not aspirated, injection not painful, no injection resistance, negative IV test and no paresthesia  Additional Notes Patient identified. Risks and benefits discussed including failed block, incomplete  Pain control, post dural puncture headache, nerve damage, paralysis, blood pressure Changes, nausea, vomiting, reactions to medications-both toxic and allergic and post Partum back pain. All questions were answered. Patient expressed understanding and wished to proceed. Sterile technique was used throughout procedure. Epidural site was Dressed with sterile barrier dressing. No paresthesias, signs of intravascular injection Or signs of intrathecal spread were encountered.  Patient was more comfortable after the epidural was dosed. Please see RN's note for documentation of vital signs and FHR which are stable.

## 2015-05-13 NOTE — MAU Provider Note (Signed)
Kelsey [name] is a 29 y.o. female, G4P2012 at 40 3/7 weeks, presenting for SROM at 0730, irregular contractions since. Leaking clear fluid, denies bleeding, reports +FM. Cervix was 3 cm at office visit last week.  Patient Active Problem List   Diagnosis Date Noted  . Labor and delivery, indication for care 05/13/2015  . Late prenatal care 05/13/2015  . SGA (small for gestational age)--5%ile at 39 weeks. 05/13/2015    History of present pregnancy: Patient entered care at 20 6/7 weeks.  EDC of 05/10/15 was established by LMP  Anatomy scan: 20 6/7 weeks, with limited anatomy findings and an anterior placenta, EFW 20%ile. Additional US evaluations:  24 6/7 weeks: EFW 1+6, 7%ile, AFI WNL, completion of anatomy, 3.08 28 1/7 weeks: EFW 2+5, 13.8%ile, normal fluid, AC lag, cervix 3.38, anterior intramural fibroid, 3.6, x 2.6 x 3.0. 32 weeks: EFW 4+0, 20%ile, AFI 13.4, 40%ile, cervix 1.9, no funneling. AC 22%ile. 39 1/7 weeks: EFW 5+14, 5%ile, AFI 9.53, 20%ile, AC lag < 2%ile, vtx, normal cervical length. Significant prenatal events: Presented late to care at 20 6/7 weeks. Signed sterilization form 02/15/15. Followed growth during pregnancy, slight AC lag by 3rd trimester, growth at 5%ile at 39 weeks. Short cervix noted at 32 weeks.  Last evaluation: 05/11/15--cervix 3 cm, 50%ile, vtx, -2, BP 124/80, membranes swept.  OB History    Gravida Para Term Preterm AB TAB SAB Ectopic Multiple Living   5 2 2  1 1    2    2011--[facility], 40 weeks, 6 hour labor, 6+12, female, epidural, [facility] 2014--SVB, 40 weeks, 7 hour labor, 6+8, female, epidural, [facility] 2015--SAB at 8 weeks.  Past Medical History  Diagnosis Date  . Medical history non-contributory    Past Surgical History  Procedure Laterality Date  . Cholecystectomy     Family History: Mother MI; Brother thyroid cancer; MGM Parkinson's dx, depression  Social History:  reports that she  quit smoking about 14 years ago. Her smoking use included Cigarettes. She does not have any smokeless tobacco history on file. She reports that she does not drink alcohol or use illicit drugs. Patient is Caucasian,    Prenatal Transfer Tool  Maternal Diabetes: No Genetic Screening: Normal Quad screen Maternal Ultrasounds/Referrals: Normal--EFW 6+14, 5%ile at 39 weeks Fetal Ultrasounds or other Referrals: None Maternal Substance Abuse: No Significant Maternal Medications: None Significant Maternal Lab Results: Lab values include: Group B Strep negative  TDAP 02/22/15 Flu NA  ROS: Leaking clear fluid, irregular UCs, +FM  No Known Allergies   Dilation: 3 Effacement (%): 90, 80 Station: -2 Exam by:: Shimica Robinson RN Blood pressure 134/76, pulse 79, temperature 97.7 F (36.5 C), temperature source Oral, resp. rate 18, height 4' 11" (1.499 m), weight 70.035 kg (154 lb 6.4 oz), unknown if currently breastfeeding.  Chest clear Heart RRR without murmur Abd gravid, NT, FH 35 cm Pelvic: As above, leaking clear fluid Ext: WNL  FHR: Category 1 UCs: q 3-6 min, mild/mod  Prenatal labs: ABO, Rh: --/--/A POS (10/01 0824)A+ Antibody: NEG (10/01 0824)Neg Rubella:  Immune RPR:   NR HBsAg:  Neg  HIV:   NR GBS: Negative (09/01 0000)Negative 04/13/15 Sickle cell/Hgb electrophoresis: AA Pap: WNL 12/27/14 GC: Negative 12/22/14 Chlamydia: Negative 12/22/14 Genetic screenings: Normal Quad screen Glucola: WNL Other:  Hgb 12.5 at NOB, 12.4 at 28 weeks   Assessment/Plan: IUP at 40 3/7 weeks Early labor GBS negative Plans epidural EFW 5%ile on 05/04/15.  Plan: Admit to Birthing Suite per consult with Dr. Airika Alkhatib   weeks    Assessment/Plan: IUP at 40 3/7 weeks Early labor GBS negative Plans epidural EFW 5%ile on 05/04/15.  Plan: Admit to Birthing Suite per consult with Dr. Richardson Dopp Routine [facility] orders Pain med/epidural prn Will observe for advancement of labor at present, augment prn.  LATHAM, VICKICNM, MN 05/13/2015, 11:59 AM

## 2015-05-13 NOTE — Anesthesia Preprocedure Evaluation (Addendum)
Anesthesia Evaluation  Patient identified by MRN, date of birth, ID band Patient awake    Reviewed: Allergy & Precautions, NPO status , Patient's Chart, lab work & pertinent test results  Airway Mallampati: II  TM Distance: >3 FB Neck ROM: Full    Dental no notable dental hx. (+) Teeth Intact   Pulmonary former smoker,    Pulmonary exam normal breath sounds clear to auscultation       Cardiovascular negative cardio ROS Normal cardiovascular exam Rhythm:Regular Rate:Normal     Neuro/Psych negative neurological ROS  negative psych ROS   GI/Hepatic Neg liver ROS, GERD  ,  Endo/Other  Obesity  Renal/GU negative Renal ROS  negative genitourinary   Musculoskeletal Scoliosis   Abdominal (+) + obese,   Peds  Hematology  (+) anemia ,   Anesthesia Other Findings   Reproductive/Obstetrics (+) Pregnancy                            Anesthesia Physical Anesthesia Plan  ASA: II  Anesthesia Plan: Epidural   Post-op Pain Management:    Induction:   Airway Management Planned: Natural Airway  Additional Equipment:   Intra-op Plan:   Post-operative Plan:   Informed Consent: I have reviewed the patients History and Physical, chart, labs and discussed the procedure including the risks, benefits and alternatives for the proposed anesthesia with the patient or authorized representative who has indicated his/her understanding and acceptance.     Plan Discussed with: Anesthesiologist  Anesthesia Plan Comments:         Anesthesia Quick Evaluation

## 2015-05-14 ENCOUNTER — Encounter (HOSPITAL_COMMUNITY)
Admission: AD | Disposition: A | Discharge status: Home / Self Care | Payer: Self-pay | Source: Ambulatory Visit | Attending: Obstetrics and Gynecology

## 2015-05-14 ENCOUNTER — Inpatient Hospital Stay (HOSPITAL_COMMUNITY): Payer: Medicaid Other | Admitting: Anesthesiology

## 2015-05-14 ENCOUNTER — Encounter (HOSPITAL_COMMUNITY): Payer: Self-pay

## 2015-05-14 HISTORY — PX: TUBAL LIGATION: SHX77

## 2015-05-14 LAB — CBC
HCT: 30.7 % — ABNORMAL LOW (ref 36.0–46.0)
HEMOGLOBIN: 10.4 g/dL — AB (ref 12.0–15.0)
MCH: 31.5 pg (ref 26.0–34.0)
MCHC: 33.9 g/dL (ref 30.0–36.0)
MCV: 93 fL (ref 78.0–100.0)
PLATELETS: 143 10*3/uL — AB (ref 150–400)
RBC: 3.3 MIL/uL — ABNORMAL LOW (ref 3.87–5.11)
RDW: 13.8 % (ref 11.5–15.5)
WBC: 7.8 10*3/uL (ref 4.0–10.5)

## 2015-05-14 SURGERY — LIGATION, FALLOPIAN TUBE, POSTPARTUM
Anesthesia: Epidural | Site: Abdomen | Laterality: Bilateral

## 2015-05-14 MED ORDER — MIDAZOLAM HCL 2 MG/2ML IJ SOLN
INTRAMUSCULAR | Status: AC
  Filled 2015-05-14: qty 4

## 2015-05-14 MED ORDER — METOCLOPRAMIDE HCL 5 MG/ML IJ SOLN: 10.0000 mg | Freq: Once | INTRAMUSCULAR | Status: DC | PRN

## 2015-05-14 MED ORDER — MEPERIDINE HCL 25 MG/ML IJ SOLN: 6.2500 mg | INTRAMUSCULAR | Status: DC | PRN

## 2015-05-14 MED ORDER — FAMOTIDINE 20 MG PO TABS
40.0000 mg | ORAL_TABLET | Freq: Once | ORAL | Status: AC
  Administered 2015-05-14: 40 mg via ORAL
  Filled 2015-05-14: qty 2

## 2015-05-14 MED ORDER — FAMOTIDINE 20 MG PO TABS: 40.0000 mg | ORAL_TABLET | Freq: Once | ORAL | Status: DC

## 2015-05-14 MED ORDER — FENTANYL CITRATE (PF) 100 MCG/2ML IJ SOLN
INTRAMUSCULAR | Status: AC
Start: 2015-05-14 — End: 2015-05-14
  Filled 2015-05-14: qty 4

## 2015-05-14 MED ORDER — ONDANSETRON HCL 4 MG/2ML IJ SOLN
INTRAMUSCULAR | Status: AC
  Filled 2015-05-14: qty 2

## 2015-05-14 MED ORDER — PROPOFOL 10 MG/ML IV BOLUS
INTRAVENOUS | Status: DC | PRN
  Administered 2015-05-14: 10 mg via INTRAVENOUS
  Administered 2015-05-14: 5 mg via INTRAVENOUS
  Administered 2015-05-14 (×3): 10 mg via INTRAVENOUS

## 2015-05-14 MED ORDER — FENTANYL CITRATE (PF) 100 MCG/2ML IJ SOLN
INTRAMUSCULAR | Status: DC | PRN
  Administered 2015-05-14 (×4): 25 ug via INTRAVENOUS

## 2015-05-14 MED ORDER — ONDANSETRON HCL 4 MG/2ML IJ SOLN
INTRAMUSCULAR | Status: DC | PRN
  Administered 2015-05-14: 4 mg via INTRAVENOUS

## 2015-05-14 MED ORDER — FENTANYL CITRATE (PF) 100 MCG/2ML IJ SOLN: 25.0000 ug | INTRAMUSCULAR | Status: DC | PRN

## 2015-05-14 MED ORDER — LACTATED RINGERS IV SOLN
INTRAVENOUS | Status: DC | PRN
  Administered 2015-05-14 (×2): via INTRAVENOUS

## 2015-05-14 MED ORDER — BUPIVACAINE HCL (PF) 0.25 % IJ SOLN
INTRAMUSCULAR | Status: DC | PRN
  Administered 2015-05-14: 30 mL

## 2015-05-14 MED ORDER — MIDAZOLAM HCL 5 MG/5ML IJ SOLN
INTRAMUSCULAR | Status: DC | PRN
  Administered 2015-05-14 (×3): .5 mg via INTRAVENOUS
  Administered 2015-05-14: 1 mg via INTRAVENOUS
  Administered 2015-05-14: .5 mg via INTRAVENOUS

## 2015-05-14 MED ORDER — SODIUM BICARBONATE 8.4 % IV SOLN
INTRAVENOUS | Status: AC
Start: 2015-05-14 — End: 2015-05-14
  Filled 2015-05-14: qty 50

## 2015-05-14 MED ORDER — METOCLOPRAMIDE HCL 10 MG PO TABS: 10.0000 mg | ORAL_TABLET | Freq: Once | ORAL | Status: DC

## 2015-05-14 MED ORDER — SODIUM BICARBONATE 8.4 % IV SOLN
INTRAVENOUS | Status: DC | PRN
  Administered 2015-05-14 (×3): 5 mL via EPIDURAL

## 2015-05-14 MED ORDER — METOCLOPRAMIDE HCL 10 MG PO TABS
10.0000 mg | ORAL_TABLET | Freq: Once | ORAL | Status: AC
  Administered 2015-05-14: 10 mg via ORAL
  Filled 2015-05-14: qty 1

## 2015-05-14 MED ORDER — LIDOCAINE-EPINEPHRINE (PF) 2 %-1:200000 IJ SOLN
INTRAMUSCULAR | Status: AC
Start: 2015-05-14 — End: 2015-05-14
  Filled 2015-05-14: qty 20

## 2015-05-14 MED ORDER — LACTATED RINGERS IV SOLN: INTRAVENOUS | Status: DC

## 2015-05-14 SURGICAL SUPPLY — 23 items
CLOTH BEACON ORANGE TIMEOUT ST (SAFETY) ×2 IMPLANT
CONTAINER PREFILL 10% NBF 15ML (MISCELLANEOUS) ×4 IMPLANT
DRAPE INCISE 13X13 STRL (DRAPES) ×2 IMPLANT
DRSG OPSITE POSTOP 3X4 (GAUZE/BANDAGES/DRESSINGS) ×2 IMPLANT
ELECT REM PT RETURN 9FT ADLT (ELECTROSURGICAL) ×2
ELECTRODE REM PT RTRN 9FT ADLT (ELECTROSURGICAL) ×1 IMPLANT
GLOVE BIOGEL M 6.5 STRL (GLOVE) ×6 IMPLANT
GLOVE BIOGEL PI IND STRL 6.5 (GLOVE) ×1 IMPLANT
GLOVE BIOGEL PI INDICATOR 6.5 (GLOVE) ×1
GOWN STRL REUS W/TWL LRG LVL3 (GOWN DISPOSABLE) ×4 IMPLANT
LIQUID BAND (GAUZE/BANDAGES/DRESSINGS) ×2 IMPLANT
MARKER SKIN DUAL TIP RULER LAB (MISCELLANEOUS) ×2 IMPLANT
NEEDLE HYPO 22GX1.5 SAFETY (NEEDLE) ×2 IMPLANT
NS IRRIG 1000ML POUR BTL (IV SOLUTION) ×2 IMPLANT
PACK ABDOMINAL MINOR (CUSTOM PROCEDURE TRAY) ×2 IMPLANT
PENCIL BUTTON HOLSTER BLD 10FT (ELECTRODE) ×2 IMPLANT
SPONGE LAP 4X18 X RAY DECT (DISPOSABLE) IMPLANT
SUT VICRYL 0 UR6 27IN ABS (SUTURE) ×4 IMPLANT
SUT VICRYL 4-0 PS2 18IN ABS (SUTURE) ×2 IMPLANT
SYR CONTROL 10ML LL (SYRINGE) ×2 IMPLANT
TOWEL OR 17X24 6PK STRL BLUE (TOWEL DISPOSABLE) ×4 IMPLANT
TRAY FOLEY CATH SILVER 14FR (SET/KITS/TRAYS/PACK) ×2 IMPLANT
WATER STERILE IRR 1000ML POUR (IV SOLUTION) ×2 IMPLANT

## 2015-05-14 NOTE — Anesthesia Postprocedure Evaluation (Signed)
  Anesthesia Post-op Note  Patient: Kelsey [name]  Procedure(s) Performed: Labor and Delivery Epidural  Patient Location: Mother/Baby  Anesthesia Type:Epidural  Level of Consciousness: awake, alert  and oriented  Airway and Oxygen Therapy: Patient Spontanous Breathing  Post-op Pain: none  Post-op Assessment: Post-op Vital signs reviewed, Patient's Cardiovascular Status Stable, Respiratory Function Stable, Patent Airway, No signs of Nausea or vomiting, Pain level controlled, No headache, No backache, Spinal receding and Patient able to bend at knees              Post-op Vital Signs: Reviewed and stable  Last Vitals:  Filed Vitals:   05/14/15 0600  BP: 111/63  Pulse: 66  Temp: 36.7 C  Resp: 17    Complications: No apparent anesthesia complications

## 2015-05-14 NOTE — Addendum Note (Signed)
Addendum  created 05/14/15 1610 by Collier Flowers, CRNA   Modules edited: Charges VN, Notes Section   Notes Section:  File: [id number]

## 2015-05-14 NOTE — Anesthesia Postprocedure Evaluation (Signed)
  Anesthesia Post-op Note  Patient: Product manager  Procedure(s) Performed: Procedure(s): POST PARTUM TUBAL LIGATION (Bilateral)  Patient Location: PACU  Anesthesia Type:Epidural  Level of Consciousness: awake, alert  and oriented  Airway and Oxygen Therapy: Patient Spontanous Breathing  Post-op Pain: none  Post-op Assessment: Post-op Vital signs reviewed, Patient's Cardiovascular Status Stable, Respiratory Function Stable, Patent Airway, No signs of Nausea or vomiting, Pain level controlled, No headache, No backache and Spinal receding LLE Motor Response: Non-purposeful movement LLE Sensation: Tingling RLE Motor Response: Non-purposeful movement RLE Sensation: Tingling      Post-op Vital Signs: Reviewed and stable  Last Vitals:  Filed Vitals:   05/14/15 1045  BP: 115/61  Pulse: 75  Temp:   Resp: 11    Complications: No apparent anesthesia complications

## 2015-05-14 NOTE — Lactation Note (Signed)
This note was copied from the chart of Kelsey [name]. Lactation Consultation Note    Initial consult with this experienced breast feeding mom and term baby. Mom was feeding baby when I walked in the room, deep latch, strong, rhythmic suckles. Mom denies having any questions/cocnersn. She will call as needed. Lactation services reviewed, and feeding diary.   Patient Name: Kelsey [name] Today's Date: 05/14/2015    Maternal Data Formula Feeding for Exclusion: No Has patient been taught Hand Expression?: No Does the patient have breastfeeding experience prior to this delivery?: Yes  Feeding Feeding Type: Breast Fed  LATCH Score/Interventions Latch: Grasps breast easily, tongue down, lips flanged, rhythmical sucking.  Audible Swallowing: A few with stimulation  Type of Nipple: Everted at rest and after stimulation (not observed - baby latched at ths time)  Comfort (Breast/Nipple): Soft / non-tender     Hold (Positioning): No assistance needed to correctly position infant at breast.  LATCH Score: 9  Lactation Tools Discussed/Used     Consult Status Consult Status: PRN Follow-up type: Call as needed    Alfred Levins 05/14/2015, 2:36 PM

## 2015-05-14 NOTE — H&P (Signed)
Date of Initial H&P: 05/13/2015  Patient is postpartum day #1 s/p SVD. She desires postpartum tubal ligation. I discussed with the patient r/o surgery including but not limited to infection bleeding damage to bowel and surrounding organs with the need for further surgery. I also informed her that this is considered a permanent form of contraception.  Pt voiced understanding and desires to proceed   History reviewed, patient examined, no change in status, stable for surgery.

## 2015-05-14 NOTE — Anesthesia Preprocedure Evaluation (Signed)
Anesthesia Evaluation  Patient identified by MRN, date of birth, ID band Patient awake    Reviewed: Allergy & Precautions, NPO status , Patient's Chart, lab work & pertinent test results  Airway Mallampati: II  TM Distance: >3 FB Neck ROM: Full    Dental no notable dental hx. (+) Teeth Intact   Pulmonary former smoker,    Pulmonary exam normal breath sounds clear to auscultation       Cardiovascular negative cardio ROS Normal cardiovascular exam Rhythm:Regular Rate:Normal     Neuro/Psych negative neurological ROS  negative psych ROS   GI/Hepatic Neg liver ROS, GERD  ,  Endo/Other  Obesity  Renal/GU negative Renal ROS  negative genitourinary   Musculoskeletal Scoliosis   Abdominal (+) + obese,   Peds  Hematology  (+) anemia ,   Anesthesia Other Findings   Reproductive/Obstetrics Desires Sterilization                             Anesthesia Physical  Anesthesia Plan  ASA: II  Anesthesia Plan: Epidural   Post-op Pain Management:    Induction:   Airway Management Planned: Natural Airway  Additional Equipment:   Intra-op Plan:   Post-operative Plan:   Informed Consent: I have reviewed the patients History and Physical, chart, labs and discussed the procedure including the risks, benefits and alternatives for the proposed anesthesia with the patient or authorized representative who has indicated his/her understanding and acceptance.     Plan Discussed with: Anesthesiologist, CRNA and Surgeon  Anesthesia Plan Comments:         Anesthesia Quick Evaluation

## 2015-05-14 NOTE — Anesthesia Postprocedure Evaluation (Signed)
  Anesthesia Post-op Note  Patient: Kelsey [name]  Procedure(s) Performed: * No procedures listed *  Patient Location: Mother/Baby  Anesthesia Type:Epidural  Level of Consciousness: awake, alert , oriented and patient cooperative  Airway and Oxygen Therapy: Patient Spontanous Breathing  Post-op Pain: mild  Post-op Assessment: Post-op Vital signs reviewed, Patient's Cardiovascular Status Stable, Respiratory Function Stable, Patent Airway, No signs of Nausea or vomiting, Adequate PO intake, Pain level controlled, No headache, No backache and Patient able to bend at knees              Post-op Vital Signs: Reviewed and stable  Last Vitals:  Filed Vitals:   05/14/15 0900  BP: 140/86  Pulse: 82  Temp: 36.5 C  Resp: 19    Complications: No apparent anesthesia complications

## 2015-05-14 NOTE — Transfer of Care (Signed)
Immediate Anesthesia Transfer of Care Note  Patient: Kelsey [name]  Procedure(s) Performed: Procedure(s): POST PARTUM TUBAL LIGATION (Bilateral)  Patient Location: PACU  Anesthesia Type:Epidural  Level of Consciousness: awake, alert  and oriented  Airway & Oxygen Therapy: Patient Spontanous Breathing  Post-op Assessment: Report given to RN and Post -op Vital signs reviewed and stable  Post vital signs: Reviewed and stable  Last Vitals:  Filed Vitals:   05/14/15 1038  BP:   Pulse:   Temp: 36.9 C  Resp:     Complications: No apparent anesthesia complications

## 2015-05-14 NOTE — Op Note (Signed)
05/13/2015 - 05/14/2015  10:45 AM  PATIENT:  Kelsey [name]  29 y.o. female  PRE-OPERATIVE DIAGNOSIS:  desires sterilization  POST-OPERATIVE DIAGNOSIS:  desires sterilization  PROCEDURE:  Procedure(s): POST PARTUM TUBAL LIGATION (Bilateral)  SURGEON:  Surgeon(s) and Role:    * Gerald Leitz, MD - Primary  PHYSICIAN ASSISTANT:   ASSISTANTS: none   ANESTHESIA:   local and epidural  EBL:  Total I/O In: 1000 [I.V.:1000] Out: 135 [Urine:125; Blood:10]  BLOOD ADMINISTERED:none  DRAINS: none and Urinary Catheter (Foley)   LOCAL MEDICATIONS USED:  MARCAINE     SPECIMEN:  Source of Specimen:  portion of the fallopian tubes bilaterally   DISPOSITION OF SPECIMEN:  PATHOLOGY  COUNTS:  YES  TOURNIQUET:  * No tourniquets in log *  DICTATION: .Dragon Dictation  PLAN OF CARE: Patient to return to postpartum floor   PATIENT DISPOSITION:  PACU - hemodynamically stable.   Delay start of Pharmacological VTE agent (>24 hrs) due to surgical blood loss or risk of bleeding: not applicable  Procedure: The patient was taken to the operating room where epidural was re-dosed. . She is placed in the dorsal lithotomy position. She was prepped and draped in the usual sterile fashion. Time out was performed. Patient was identified as Kelsey [name].  Her epidural was found to be sub optimal.. The skin and subcutaneous tissue 2 cm below the umbilicus was injected with 25% marcaine. Adequate anesthesia was obtained. An infraumbilical incision was made 2 cm below the umbilicus and carried down to the fascia. The fascia was tented up and entered sharply this was extended laterally.  The peritoneum was identified tented up and entered sharply.  The right fallopian tube was identified followed to the fimbriated end. The fallopian tube was  grasped with a babcock clamp. A window was made in the mesosalpinx with the Bovie under the isthmic portion of the tube. . The medial aspect of the fallopian tube was  sutured ligated with 0 plain gut. The lateral aspect of the right fallopian tube was suture ligated with plain gut. The intervening segment was excised and sent to pathology.   The left  fallopian tube was identified followed to the fimbriated end. The left fallopian tube was  grasped with a babcock clamp. A window was made in the mesosalpinx with the Bovie under the isthmic portion of the tube. . The medial aspect of the left  fallopian tube was sutured ligated with 0 plain gut. The lateral aspect of the left  fallopian tube was suture ligated with plain gut. The intervening segment was excised and sent to pathology.    The fascia was re approximated with 0 vicryl. The skin was closed with 4-0 vicryl. Sponge lap and needle counts were correct x 2. The patient was taken to the recovery room awake and in stable condition.

## 2015-05-15 ENCOUNTER — Encounter (HOSPITAL_COMMUNITY): Payer: Self-pay | Admitting: Obstetrics and Gynecology

## 2015-05-15 MED ORDER — IBUPROFEN 600 MG PO TABS: 600.0000 mg | ORAL_TABLET | Freq: Four times a day (QID) | ORAL | Status: DC | PRN

## 2015-05-15 MED ORDER — OXYCODONE-ACETAMINOPHEN 5-325 MG PO TABS: 1.0000 | ORAL_TABLET | ORAL | Status: DC | PRN

## 2015-05-15 NOTE — Addendum Note (Signed)
Addendum  created 05/15/15 0730 by Jhonnie Garner, CRNA   Modules edited: Notes Section   Notes Section:  File: [id number]

## 2015-05-15 NOTE — Anesthesia Postprocedure Evaluation (Signed)
Anesthesia Post Note  Patient: Kelsey [name]  Procedure(s) Performed: Procedure(s) (LRB): POST PARTUM TUBAL LIGATION (Bilateral)  Anesthesia type: Epidural  Patient location: Mother/Baby  Post pain: Pain level controlled  Post assessment: Post-op Vital signs reviewed  Last Vitals:  Filed Vitals:   05/15/15 0615  BP: 122/78  Pulse: 70  Temp: 36.6 C  Resp: 16    Post vital signs: Reviewed  Level of consciousness: awake  Complications: No apparent anesthesia complications

## 2015-05-15 NOTE — Lactation Note (Signed)
This note was copied from the chart of Kelsey Kelsey [name]. Lactation Consultation Note  Patient Name: Kelsey [name] [id number] Date: 05/15/2015 Reason for consult: Follow-up assessment Baby 42 hours old. Mom reports that baby is nursing well. Mom had questions/concerns about milk supply stating that her supply usually is not enough after a couple of months. Discussed supply and demand and impact of introducing formula. Mom aware of OP/BFSG and LC phone line assistance after D/C.   Maternal Data    Feeding    LATCH Score/Interventions                      Lactation Tools Discussed/Used     Consult Status Consult Status: Complete    Geralynn Ochs 05/15/2015, 9:18 AM

## 2015-05-15 NOTE — Discharge Instructions (Signed)
Postpartum Tubal Ligation °Care After °Refer to this sheet in the next few weeks. These instructions provide you with information on caring for yourself after your procedure. Your caregiver may also give you more specific instructions. Your treatment has been planned according to current medical practices, but problems sometimes occur. Call your caregiver if you have any problems or questions after your procedure. °HOME CARE INSTRUCTIONS  °· Rest the remainder of the day. °· Only take over-the-counter or prescription medicines for pain, discomfort, or fever as directed by your caregiver. Do not take aspirin. It can cause bleeding. °· Gradually resume daily activities, diet, rest, driving, and work. °· Avoid sexual intercourse for 2 weeks or as directed. °· Do not drive while taking pain medicine. °· Do not lift anything over 5 pounds for 2 weeks or as directed. °· Only take showers, not baths, until you are seen by your caregiver. °· Change bandages (dressings) as directed. °· Take your temperature twice a day and record it. °· Try to have help for the first 7-10 days for your household needs. °· Return to your caregiver to get your stitches (sutures) removed and for follow-up visits as directed. °SEEK MEDICAL CARE IF:  °· You have redness, swelling, or increasing pain in the wound. °· You have drainage from the wound lasting longer than 1 day. °· Your pain is getting worse. °· You have a rash. °· You become dizzy or lightheaded. °· You have a reaction to your medicine. °· You need stronger medicine or a change in your pain medicine. °· You notice a bad smell coming from the wound or dressing. °· Your wound breaks open after the sutures have been removed. °· You are constipated. °SEEK IMMEDIATE MEDICAL CARE IF:  °· You faint. °· You have a fever. °· You have increasing abdominal pain. °· You have severe pain in your shoulders. °· You have bleeding or drainage from the suture sites or vagina following surgery. °· You  have shortness of breath or difficulty breathing. °· You have chest or leg pain. °· You have persistent nausea, vomiting, or diarrhea. °MAKE SURE YOU:  °· Understand these instructions. °· Watch your condition. °· Get help right away if you are not doing well or get worse. °Document Released: 01/28/2012 Document Reviewed: 01/28/2012 °ExitCare® Patient Information ©2015 [employer]. This information is not intended to replace advice given to you by your health care provider. Make sure you discuss any questions you have with your health care provider. ° °Postpartum Care After Vaginal Delivery °After you deliver your newborn (postpartum period), the usual stay in the hospital is 24-72 hours. If there were problems with your labor or delivery, or if you have other medical problems, you might be in the hospital longer.  °While you are in the hospital, you will receive help and instructions on how to care for yourself and your newborn during the postpartum period.  °While you are in the hospital: °· Be sure to tell your nurses if you have pain or discomfort, as well as where you feel the pain and what makes the pain worse. °· If you had an incision made near your vagina (episiotomy) or if you had some tearing during delivery, the nurses may put ice packs on your episiotomy or tear. The ice packs may help to reduce the pain and swelling. °· If you are breastfeeding, you may feel uncomfortable contractions of your uterus for a couple of weeks. This is normal. The contractions help your uterus get   back to normal size. °· It is normal to have some bleeding after delivery. °¨ For the first 1-3 days after delivery, the flow is red and the amount may be similar to a period. °¨ It is common for the flow to start and stop. °¨ In the first few days, you may pass some small clots. Let your nurses know if you begin to pass large clots or your flow increases. °¨ Do not  flush blood clots down the toilet before having the nurse look  at them. °¨ During the next 3-10 days after delivery, your flow should become more watery and pink or brown-tinged in color. °¨ Ten to fourteen days after delivery, your flow should be a small amount of yellowish-white discharge. °¨ The amount of your flow will decrease over the first few weeks after delivery. Your flow may stop in 6-8 weeks. Most women have had their flow stop by 12 weeks after delivery. °· You should change your sanitary pads frequently. °· Wash your hands thoroughly with soap and water for at least 20 seconds after changing pads, using the toilet, or before holding or feeding your newborn. °· You should feel like you need to empty your bladder within the first 6-8 hours after delivery. °· In case you become weak, lightheaded, or faint, call your nurse before you get out of bed for the first time and before you take a shower for the first time. °· Within the first few days after delivery, your breasts may begin to feel tender and full. This is called engorgement. Breast tenderness usually goes away within 48-72 hours after engorgement occurs. You may also notice milk leaking from your breasts. If you are not breastfeeding, do not stimulate your breasts. Breast stimulation can make your breasts produce more milk. °· Spending as much time as possible with your newborn is very important. During this time, you and your newborn can feel close and get to know each other. Having your newborn stay in your room (rooming in) will help to strengthen the bond with your newborn.  It will give you time to get to know your newborn and become comfortable caring for your newborn. °· Your hormones change after delivery. Sometimes the hormone changes can temporarily cause you to feel sad or tearful. These feelings should not last more than a few days. If these feelings last longer than that, you should talk to your caregiver. °· If desired, talk to your caregiver about methods of family planning or  contraception. °· Talk to your caregiver about immunizations. Your caregiver may want you to have the following immunizations before leaving the hospital: °¨ Tetanus, diphtheria, and pertussis (Tdap) or tetanus and diphtheria (Td) immunization. It is very important that you and your family (including grandparents) or others caring for your newborn are up-to-date with the Tdap or Td immunizations. The Tdap or Td immunization can help protect your newborn from getting ill. °¨ Rubella immunization. °¨ Varicella (chickenpox) immunization. °¨ Influenza immunization. You should receive this annual immunization if you did not receive the immunization during your pregnancy. °Document Released: 05/26/2007 Document Revised: 04/22/2012 Document Reviewed: 03/25/2012 °ExitCare® Patient Information ©2015 [employer]. This information is not intended to replace advice given to you by your health care provider. Make sure you discuss any questions you have with your health care provider. ° ° °

## 2015-05-15 NOTE — Discharge Summary (Signed)
  Vaginal Delivery Discharge Summary  Kelsey [name]  DOB:    02-21-1986 MRN:    [id number] CSN:    [id number]  Date of admission:                  05/13/15  Date of discharge:                   05/15/15  Procedures this admission:    SVD  Date of Delivery: 05/13/15  Newborn Data:  Live born female  Birth Weight: 6 lb 1.2 oz (2755 g) APGAR: 9, 9  Home with mother. Name: " Kelsey [name]"   History of Present Illness:  Ms. Kelsey [name] is a 29 y.o. female, [id number], who presents at [redacted]w[redacted]d weeks gestation. The patient has been followed at  [facility] and Gynecology division of [facility]. She was admitted for onset of labor. Her pregnancy has been complicated by:  Patient Active Problem List   Diagnosis Date Noted  . Late prenatal care 05/13/2015  . SGA (small for gestational age)--5%ile at 39 weeks. 05/13/2015  . Vaginal delivery 05/13/2015     Hospital Course:   Admitting Dx:  SROM, early labor GBS Status:  negative Delivering Clinician: Nigel Bridgeman, CNM Lacerations/MLE: none Complications: none  Intrapartum Procedures: spontaneous vaginal delivery Postpartum Procedures: P.P. tubal ligation   Additional Information: n/a  Discharge Diagnoses: Term Pregnancy-delivered  Feeding:  breast  Contraception:  bilateral tubal ligation  Hemoglobin Results:  CBC Latest Ref Rng 05/14/2015 05/13/2015 03/08/2014  WBC 4.0 - 10.5 K/uL 7.8 7.1 10.3  Hemoglobin 12.0 - 15.0 g/dL 10.4(L) 11.2(L) 13.6  Hematocrit 36.0 - 46.0 % 30.7(L) 32.6(L) 38.3  Platelets 150 - 400 K/uL 143(L) 154 226     Discharge Physical Exam:   General: alert Lochia: appropriate Uterine Fundus: firm DVT Evaluation: No evidence of DVT seen on physical exam.   Discharge Information:  Activity:           pelvic rest Diet:                routine Medications: Ibuprofen and Percocet Condition:      stable Instructions:  Routine pp instructions   Discharge to:  home  Follow-up Information    Follow up with [facility]. Schedule an appointment as soon as possible for a visit in 6 weeks.   Specialty:  Obstetrics and Gynecology   Why:  As needed   Contact information:   [address] [place] [address] [phone]       Alphonzo Severance CNM 05/15/2015 11:48 AM

## 2015-05-17 ENCOUNTER — Inpatient Hospital Stay (HOSPITAL_COMMUNITY): Admission: RE | Admit: 2015-05-17 | Payer: Medicaid Other | Source: Ambulatory Visit

## 2015-05-24 NOTE — H&P (Signed)
Kelsey [name] is a 29 y.o. female, [id number] at 13 3/7 weeks, presenting for SROM at 0730, irregular contractions since. Leaking clear fluid, denies bleeding, reports +FM. Cervix was 3 cm at office visit last week.  Patient Active Problem List   Diagnosis Date Noted  . Labor and delivery, indication for care 05/13/2015  . Late prenatal care 05/13/2015  . SGA (small for gestational age)--5%ile at 39 weeks. 05/13/2015    History of present pregnancy: Patient entered care at 20 6/7 weeks.  EDC of 05/10/15 was established by LMP  Anatomy scan: 20 6/7 weeks, with limited anatomy findings and an anterior placenta, EFW 20%ile. Additional [place] evaluations:  24 6/7 weeks: EFW 1+6, 7%ile, AFI WNL, completion of anatomy, 3.08 28 1/7 weeks: EFW 2+5, 13.8%ile, normal fluid, AC lag, cervix 3.38, anterior intramural fibroid, 3.6, x 2.6 x 3.0. 32 weeks: EFW 4+0, 20%ile, AFI 13.4, 40%ile, cervix 1.9, no funneling. AC 22%ile. 39 1/7 weeks: EFW 5+14, 5%ile, AFI 9.53, 20%ile, AC lag < 2%ile, vtx, normal cervical length. Significant prenatal events: Presented late to care at 20 6/7 weeks. Signed sterilization form 02/15/15. Followed growth during pregnancy, slight AC lag by 3rd trimester, growth at 5%ile at 39 weeks. Short cervix noted at 32 weeks.  Last evaluation: 05/11/15--cervix 3 cm, 50%ile, vtx, -2, BP 124/80, membranes swept.  OB History    Gravida Para Term Preterm AB TAB SAB Ectopic Multiple Living   2011--SVB, 40 weeks, 6 hour labor, 6+12, female, epidural, [facility] 2014--SVB, 40 weeks, 7 hour labor, 6+8, female, epidural, [facility] 2015--SAB at 8 weeks.  Past Medical History  Diagnosis Date  . Medical history non-contributory    Past Surgical History  Procedure Laterality Date  . Cholecystectomy     Family History: Mother MI; Brother thyroid cancer; MGM Parkinson's dx, depression  Social History:  reports that she  quit smoking about 14 years ago. Her smoking use included Cigarettes. She does not have any smokeless tobacco history on file. She reports that she does not drink alcohol or use illicit drugs. Patient is Caucasian,    Prenatal Transfer Tool  Maternal Diabetes: No Genetic Screening: Normal Quad screen Maternal Ultrasounds/Referrals: Normal--EFW 6+14, 5%ile at 39 weeks Fetal Ultrasounds or other Referrals: None Maternal Substance Abuse: No Significant Maternal Medications: None Significant Maternal Lab Results: Lab values include: Group B Strep negative  TDAP 02/22/15 Flu NA  ROS: Leaking clear fluid, irregular UCs, +FM  No Known Allergies   Dilation: 3 Effacement (%): 90, 80 Station: -2 Exam by:: [employer] RN Blood pressure 134/76, pulse 79, temperature 97.7 F (36.5 C), temperature source Oral, resp. rate 18, height  (1.499 m), weight 70.035 kg (154 lb 6.4 oz), unknown if currently breastfeeding.  Chest clear Heart RRR without murmur Abd gravid, NT, FH 35 cm Pelvic: As above, leaking clear fluid Ext: WNL  FHR: Category 1 UCs: q 3-6 min, mild/mod  Prenatal labs: ABO, Rh: --/--/A POS (10/01 0824)A+ Antibody: NEG (10/01 0824)Neg Rubella:  Immune RPR:   NR HBsAg:  Neg  HIV:   NR GBS: Negative (09/01 0000)Negative 04/13/15 Sickle cell/Hgb electrophoresis: AA Pap: WNL 12/27/14 GC: Negative 12/22/14 Chlamydia: Negative 12/22/14 Genetic screenings: Normal Quad screen Glucola: WNL Other:  Hgb 12.5 at NOB, 12.4 at 28 weeks   Assessment/Plan: IUP at 40 3/7 weeks Early labor GBS negative Plans epidural EFW 5%ile on 05/04/15.  Plan: Admit to Birthing Suite per consult with Dr. Richardson Dopp  Routine [facility] orders Pain med/epidural prn Will observe for advancement of labor at present, augment prn.  Hairo Garraway, [place], [place] 05/13/2015, 11:59 AM

## 2015-08-19 ENCOUNTER — Emergency Department (HOSPITAL_COMMUNITY): Payer: Medicaid Other

## 2015-08-19 ENCOUNTER — Encounter (HOSPITAL_COMMUNITY): Payer: Self-pay | Admitting: Emergency Medicine

## 2015-08-19 ENCOUNTER — Emergency Department (HOSPITAL_COMMUNITY)
Admission: EM | Admit: 2015-08-19 | Discharge: 2015-08-19 | Disposition: A | Discharge status: Home / Self Care | Payer: Medicaid Other | Attending: Emergency Medicine | Admitting: Emergency Medicine

## 2015-08-19 DIAGNOSIS — N201 Calculus of ureter: Secondary | ICD-10-CM | POA: Diagnosis not present

## 2015-08-19 DIAGNOSIS — M419 Scoliosis, unspecified: Secondary | ICD-10-CM | POA: Insufficient documentation

## 2015-08-19 DIAGNOSIS — Z3202 Encounter for pregnancy test, result negative: Secondary | ICD-10-CM | POA: Diagnosis not present

## 2015-08-19 DIAGNOSIS — Z79899 Other long term (current) drug therapy: Secondary | ICD-10-CM | POA: Diagnosis not present

## 2015-08-19 DIAGNOSIS — R109 Unspecified abdominal pain: Secondary | ICD-10-CM | POA: Diagnosis present

## 2015-08-19 DIAGNOSIS — Z87891 Personal history of nicotine dependence: Secondary | ICD-10-CM | POA: Insufficient documentation

## 2015-08-19 LAB — URINE MICROSCOPIC-ADD ON

## 2015-08-19 LAB — COMPREHENSIVE METABOLIC PANEL
ALK PHOS: 68 U/L (ref 38–126)
ALT: 25 U/L (ref 14–54)
AST: 25 U/L (ref 15–41)
Albumin: 4.3 g/dL (ref 3.5–5.0)
Anion gap: 12 (ref 5–15)
BILIRUBIN TOTAL: 0.4 mg/dL (ref 0.3–1.2)
BUN: 12 mg/dL (ref 6–20)
CALCIUM: 9.4 mg/dL (ref 8.9–10.3)
CO2: 23 mmol/L (ref 22–32)
CREATININE: 0.72 mg/dL (ref 0.44–1.00)
Chloride: 103 mmol/L (ref 101–111)
GFR calc Af Amer: >60 mL/min (ref >60)
Glucose, Bld: 116 mg/dL — ABNORMAL HIGH (ref 65–99)
Potassium: 4.1 mmol/L (ref 3.5–5.1)
Sodium: 138 mmol/L (ref 135–145)
Total Protein: 7.1 g/dL (ref 6.5–8.1)

## 2015-08-19 LAB — URINALYSIS, ROUTINE W REFLEX MICROSCOPIC
BILIRUBIN URINE: NEGATIVE
GLUCOSE, UA: NEGATIVE mg/dL
Ketones, ur: 15 mg/dL — AB
Nitrite: NEGATIVE
PROTEIN: NEGATIVE mg/dL
Specific Gravity, Urine: 1.023 (ref 1.005–1.030)
pH: 6 (ref 5.0–8.0)

## 2015-08-19 LAB — CBC WITH DIFFERENTIAL/PLATELET
BASOS ABS: 0 10*3/uL (ref 0.0–0.1)
Basophils Relative: 0 %
Eosinophils Absolute: 0.1 10*3/uL (ref 0.0–0.7)
Eosinophils Relative: 1 %
HEMATOCRIT: 40.2 % (ref 36.0–46.0)
HEMOGLOBIN: 13.6 g/dL (ref 12.0–15.0)
LYMPHS ABS: 1.7 10*3/uL (ref 0.7–4.0)
LYMPHS PCT: 17 %
MCH: 30.5 pg (ref 26.0–34.0)
MCHC: 33.8 g/dL (ref 30.0–36.0)
MCV: 90.1 fL (ref 78.0–100.0)
Monocytes Absolute: 0.4 10*3/uL (ref 0.1–1.0)
Monocytes Relative: 4 %
NEUTROS ABS: 7.9 10*3/uL — AB (ref 1.7–7.7)
Neutrophils Relative %: 78 %
Platelets: 225 10*3/uL (ref 150–400)
RBC: 4.46 MIL/uL (ref 3.87–5.11)
RDW: 12.8 % (ref 11.5–15.5)
WBC: 10 10*3/uL (ref 4.0–10.5)

## 2015-08-19 LAB — LIPASE, BLOOD: Lipase: 25 U/L (ref 11–51)

## 2015-08-19 LAB — PREGNANCY, URINE: PREG TEST UR: NEGATIVE

## 2015-08-19 MED ORDER — ONDANSETRON 4 MG PO TBDP: 4.0000 mg | ORAL_TABLET | Freq: Three times a day (TID) | ORAL | Status: DC | PRN

## 2015-08-19 MED ORDER — OXYCODONE-ACETAMINOPHEN 5-325 MG PO TABS: 2.0000 | ORAL_TABLET | ORAL | Status: DC | PRN

## 2015-08-19 MED ORDER — ONDANSETRON HCL 4 MG/2ML IJ SOLN
4.0000 mg | Freq: Once | INTRAMUSCULAR | Status: AC
  Administered 2015-08-19: 4 mg via INTRAVENOUS
  Filled 2015-08-19: qty 2

## 2015-08-19 MED ORDER — GLYCOPYRROLATE 0.2 MG/ML IJ SOLN
0.1000 mg | Freq: Once | INTRAMUSCULAR | Status: AC
  Administered 2015-08-19: 0.1 mg via INTRAVENOUS
  Filled 2015-08-19: qty 1

## 2015-08-19 MED ORDER — TAMSULOSIN HCL 0.4 MG PO CAPS
0.4000 mg | ORAL_CAPSULE | Freq: Once | ORAL | Status: AC
  Administered 2015-08-19: 0.4 mg via ORAL
  Filled 2015-08-19: qty 1

## 2015-08-19 MED ORDER — KETOROLAC TROMETHAMINE 30 MG/ML IJ SOLN
30.0000 mg | Freq: Once | INTRAMUSCULAR | Status: AC
  Administered 2015-08-19: 30 mg via INTRAVENOUS
  Filled 2015-08-19: qty 1

## 2015-08-19 MED ORDER — TAMSULOSIN HCL 0.4 MG PO CAPS: 0.4000 mg | ORAL_CAPSULE | Freq: Every day | ORAL | Status: DC

## 2015-08-19 MED ORDER — MORPHINE SULFATE (PF) 4 MG/ML IV SOLN
4.0000 mg | INTRAVENOUS | Status: DC | PRN
Start: 2015-08-19 — End: 2015-08-19
  Administered 2015-08-19 (×2): 4 mg via INTRAVENOUS
  Filled 2015-08-19 (×2): qty 1

## 2015-08-19 NOTE — ED Notes (Signed)
Discharge instructions reviewed, patient and family denied any other questions. Pt ambulates independently at time of dc. Rx x 3 given.

## 2015-08-19 NOTE — ED Notes (Signed)
Pt here with lower left abdominal pain with nausea and vomiting since last night.

## 2015-08-19 NOTE — ED Provider Notes (Signed)
CSN: [id number]     Arrival date & time 08/19/15  1145 History   First MD Initiated Contact with Patient 08/19/15 1148     Chief Complaint  Patient presents with  . Abdominal Pain      HPI  She presents for evaluation of left-sided abdominal pain. She awakened during the night with some cramping left mid abdominal pain.  This is reminiscent someone of her "gallbladder". Is status post cholecystectomy. No radiating to her back, and somewhat to suprapubic abdomen. No hematuria. No dysuria or frequency. One episode of vomiting. No diarrhea. Last bowel movement yesterday. She is 3 months postpartum from a normal vaginal delivery. Had ligation after delivery. Did have return of menses "over Christmas". Normal for 5 days for her. Since her symptoms somewhat resemble menstrual cramps, but she states the pain into her back is unusual for her. No fevers no chills. No other symptoms.  Past Medical History  Diagnosis Date  . Medical history non-contributory   . Scoliosis     no treatment required   Past Surgical History  Procedure Laterality Date  . Cholecystectomy    . Tubal ligation Bilateral 05/14/2015    Procedure: POST PARTUM TUBAL LIGATION;  Surgeon: Gerald Leitz, MD;  Location: [facility];  Service: Gynecology;  Laterality: Bilateral;   History reviewed. No pertinent family history. Social History  Substance Use Topics  . Smoking status: Former Smoker    Types: Cigarettes    Quit date: 08/13/2000  . Smokeless tobacco: None  . Alcohol Use: No   OB History    Gravida Para Term Preterm AB TAB SAB Ectopic Multiple Living   5 3 3  1 1    0 3     Review of Systems  Constitutional: Negative for fever, chills, diaphoresis, appetite change and fatigue.  HENT: Negative for mouth sores, sore throat and trouble swallowing.   Eyes: Negative for visual disturbance.  Respiratory: Negative for cough, chest tightness, shortness of breath and wheezing.   Cardiovascular: Negative for chest pain.    Gastrointestinal: Positive for nausea, vomiting and abdominal pain. Negative for diarrhea and abdominal distention.  Endocrine: Negative for polydipsia, polyphagia and polyuria.  Genitourinary: Negative for dysuria, frequency and hematuria.  Musculoskeletal: Positive for back pain. Negative for gait problem.  Skin: Negative for color change, pallor and rash.  Neurological: Negative for dizziness, syncope, light-headedness and headaches.  Hematological: Does not bruise/bleed easily.  Psychiatric/Behavioral: Negative for behavioral problems and confusion.      Allergies  Review of patient's allergies indicates no known allergies.  Home Medications   Prior to Admission medications   Medication Sig Start Date End Date Taking? Authorizing Provider  ibuprofen (ADVIL,MOTRIN) 600 MG tablet Take 1 tablet (600 mg total) by mouth every 6 (six) hours as needed. Patient not taking: Reported on 08/19/2015 05/15/15   Osborn Coho, MD  ondansetron (ZOFRAN ODT) 4 MG disintegrating tablet Take 1 tablet (4 mg total) by mouth every 8 (eight) hours as needed for nausea. 08/19/15   Rolland Porter, MD  oxyCODONE-acetaminophen (PERCOCET/ROXICET) 5-325 MG tablet Take 2 tablets by mouth every 4 (four) hours as needed. 08/19/15   Rolland Porter, MD  Prenatal Vit-Fe Fumarate-FA (MULTIVITAMIN-PRENATAL) 27-0.8 MG TABS tablet Take 1 tablet by mouth daily at 12 noon.    Historical Provider, MD  tamsulosin (FLOMAX) 0.4 MG CAPS capsule Take 1 capsule (0.4 mg total) by mouth daily. 08/19/15   Rolland Porter, MD   BP 128/86 mmHg  Pulse 80  Temp(Src) 98.3 F (  36.8 C) (Oral)  Resp 16  SpO2 100%  LMP 08/06/2015 Physical Exam  Constitutional: She is oriented to person, place, and time. She appears well-developed and well-nourished. No distress.  HENT:  Head: Normocephalic.  Eyes: Conjunctivae are normal. Pupils are equal, round, and reactive to light. No scleral icterus.  Neck: Normal range of motion. Neck supple. No thyromegaly  present.  Cardiovascular: Normal rate and regular rhythm.  Exam reveals no gallop and no friction rub.   No murmur heard. Pulmonary/Chest: Effort normal and breath sounds normal. No respiratory distress. She has no wheezes. She has no rales.  Abdominal: Soft. Bowel sounds are normal. She exhibits no distension. There is no tenderness. There is no rebound.    Normal active bowel sounds. No guarding or rigidity. No tenderness to percuss in the flank.  Musculoskeletal: Normal range of motion.  Neurological: She is alert and oriented to person, place, and time.  Skin: Skin is warm and dry. No rash noted.  Psychiatric: She has a normal mood and affect. Her behavior is normal.    ED Course  Procedures (including critical care time) Labs Review Labs Reviewed  CBC WITH DIFFERENTIAL/PLATELET - Abnormal; Notable for the following:    Neutro Abs 7.9 (*)    All other components within normal limits  COMPREHENSIVE METABOLIC PANEL - Abnormal; Notable for the following:    Glucose, Bld 116 (*)    All other components within normal limits  URINALYSIS, ROUTINE W REFLEX MICROSCOPIC (NOT AT [facility]) - Abnormal; Notable for the following:    Color, Urine AMBER (*)    APPearance TURBID (*)    Hgb urine dipstick LARGE (*)    Ketones, ur 15 (*)    Leukocytes, UA SMALL (*)    All other components within normal limits  URINE MICROSCOPIC-ADD ON - Abnormal; Notable for the following:    Squamous Epithelial / LPF 6-30 (*)    Bacteria, UA FEW (*)    All other components within normal limits  PREGNANCY, URINE  LIPASE, BLOOD    Imaging Review Ct Renal Stone Study  08/19/2015  CLINICAL DATA:  Left flank pain.  Nausea and vomiting and hematuria EXAM: CT ABDOMEN AND PELVIS WITHOUT CONTRAST TECHNIQUE: Multidetector CT imaging of the abdomen and pelvis was performed following the standard protocol without IV contrast. COMPARISON:  None FINDINGS: Lower chest: The lung bases appear clear. No pleural or pericardial  effusion. Hepatobiliary: There is no suspicious liver abnormality identified. Previous cholecystectomy. No intrahepatic bile duct dilatation. Pancreas: Negative Spleen: Negative Adrenals/Urinary Tract: The adrenal glands are normal. Normal appearance of the right kidney. There is left-sided perinephric fat stranding and hydronephrosis. There is a small stone within the proximal left ureter which measures 2-3 mm, image 43 of series 3. The urinary bladder is normal. Stomach/Bowel: The stomach is within normal limits. The small bowel loops have a normal course and caliber. No obstruction. Normal appearance of the colon. The appendix is visualized and appears normal. Vascular/Lymphatic: Normal appearance of the abdominal aorta. No enlarged retroperitoneal or mesenteric adenopathy. No enlarged pelvic or inguinal lymph nodes. Reproductive: The uterus and adnexal structures are unremarkable. Other: No free fluid or fluid collections within the abdomen or pelvis. Musculoskeletal: There is a mild lumbar scoliosis deformity which is convex towards the left. IMPRESSION: 1. Left-sided hydronephrosis secondary to 2-3 mm proximal left ureteral calculus. Electronically Signed   By: Signa Kellaylor  Stroud M.D.   On: 08/19/2015 14:04   I have personally reviewed and evaluated these images and  lab results as part of my medical decision-making.   EKG Interpretation None      MDM   Final diagnoses:  Ureteral stone    Hematuria without sign of infection. No leukocytosis or fever. Intact renal function. 2-3 mm left proximal ureteral stone with hydronephrosis. Appropriate for discharge home. Her symptoms are controlled.   Rolland Porter, MD 08/19/15 [phone]

## 2015-08-19 NOTE — Discharge Instructions (Signed)
Stay hydrated, push fluids. Follow-up with urology for 5 days if not improving. Return here with worsening pain, fever, uncontrolled nausea.  Kidney Stones Kidney stones (urolithiasis) are deposits that form inside your kidneys. The intense pain is caused by the stone moving through the urinary tract. When the stone moves, the ureter goes into spasm around the stone. The stone is usually passed in the urine.  CAUSES   A disorder that makes certain neck glands produce too much parathyroid hormone (primary hyperparathyroidism).  A buildup of uric acid crystals, similar to gout in your joints.  Narrowing (stricture) of the ureter.  A kidney obstruction present at birth (congenital obstruction).  Previous surgery on the kidney or ureters.  Numerous kidney infections. SYMPTOMS   Feeling sick to your stomach (nauseous).  Throwing up (vomiting).  Blood in the urine (hematuria).  Pain that usually spreads (radiates) to the groin.  Frequency or urgency of urination. DIAGNOSIS   Taking a history and physical exam.  Blood or urine tests.  CT scan.  Occasionally, an examination of the inside of the urinary bladder (cystoscopy) is performed. TREATMENT   Observation.  Increasing your fluid intake.  Extracorporeal shock wave lithotripsy--This is a noninvasive procedure that uses shock waves to break up kidney stones.  Surgery may be needed if you have severe pain or persistent obstruction. There are various surgical procedures. Most of the procedures are performed with the use of small instruments. Only small incisions are needed to accommodate these instruments, so recovery time is minimized. The size, location, and chemical composition are all important variables that will determine the proper choice of action for you. Talk to your health care provider to better understand your situation so that you will minimize the risk of injury to yourself and your kidney.  HOME CARE  INSTRUCTIONS   Drink enough water and fluids to keep your urine clear or pale yellow. This will help you to pass the stone or stone fragments.  Strain all urine through the provided strainer. Keep all particulate matter and stones for your health care provider to see. The stone causing the pain may be as small as a grain of salt. It is very important to use the strainer each and every time you pass your urine. The collection of your stone will allow your health care provider to analyze it and verify that a stone has actually passed. The stone analysis will often identify what you can do to reduce the incidence of recurrences.  Only take over-the-counter or prescription medicines for pain, discomfort, or fever as directed by your health care provider.  Keep all follow-up visits as told by your health care provider. This is important.  Get follow-up X-rays if required. The absence of pain does not always mean that the stone has passed. It may have only stopped moving. If the urine remains completely obstructed, it can cause loss of kidney function or even complete destruction of the kidney. It is your responsibility to make sure X-rays and follow-ups are completed. Ultrasounds of the kidney can show blockages and the status of the kidney. Ultrasounds are not associated with any radiation and can be performed easily in a matter of minutes.  Make changes to your daily diet as told by your health care provider. You may be told to:  Limit the amount of salt that you eat.  Eat 5 or more servings of fruits and vegetables each day.  Limit the amount of meat, poultry, fish, and eggs that you eat.  Collect a 24-hour urine sample as told by your health care provider.You may need to collect another urine sample every 6-12 months. SEEK MEDICAL CARE IF:  You experience pain that is progressive and unresponsive to any pain medicine you have been prescribed. SEEK IMMEDIATE MEDICAL CARE IF:   Pain cannot be  controlled with the prescribed medicine.  You have a fever or shaking chills.  The severity or intensity of pain increases over 18 hours and is not relieved by pain medicine.  You develop a new onset of abdominal pain.  You feel faint or pass out.  You are unable to urinate.   This information is not intended to replace advice given to you by your health care provider. Make sure you discuss any questions you have with your health care provider.   Document Released: 07/29/2005 Document Revised: 04/19/2015 Document Reviewed: 12/30/2012 Elsevier Interactive Patient Education [employer]2016 Elsevier Inc.

## 2016-02-24 IMAGING — CT CT RENAL STONE PROTOCOL
2 of 4 series · 16 of 46 positions shown, 18 images · non-contrast
Comparison: None

CLINICAL DATA: Left flank pain.  Nausea and vomiting and hematuria

EXAM:
CT ABDOMEN AND PELVIS WITHOUT CONTRAST
TECHNIQUE: Multidetector CT imaging of the abdomen and pelvis was performed
following the standard protocol without IV contrast.

[Series 3: renal stone 5mm · axial · 0.74mm/px · z∈[-950,-535]mm · 13 of 91 slices shown, 15 images]
[im 4/91  soft-tissue]
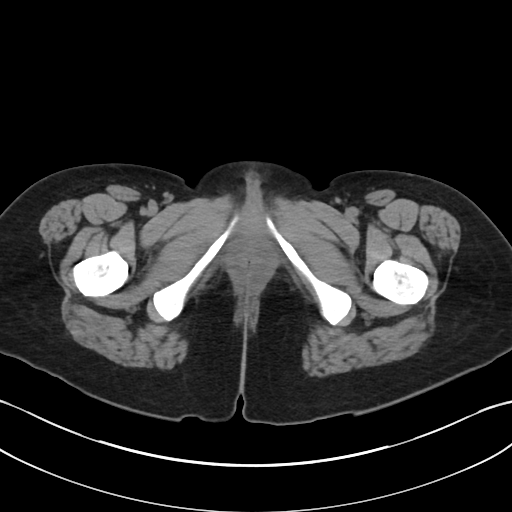
[im 4/91  bone]
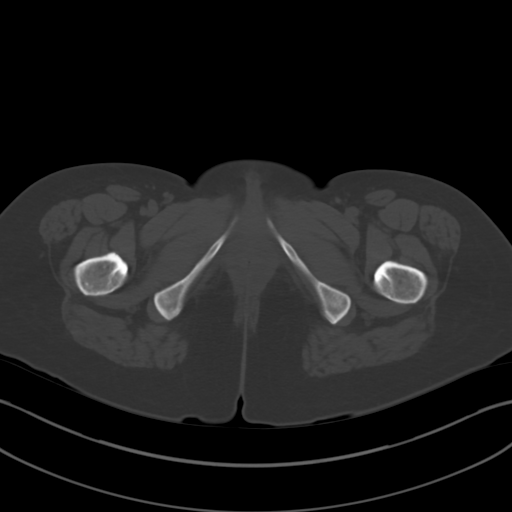
[im 11/91  soft-tissue]
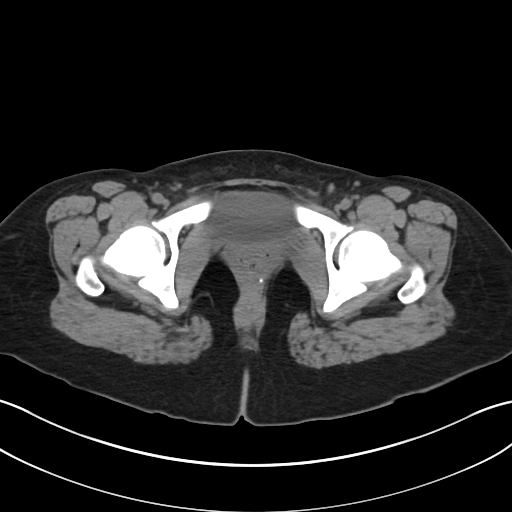
[im 19/91  soft-tissue]
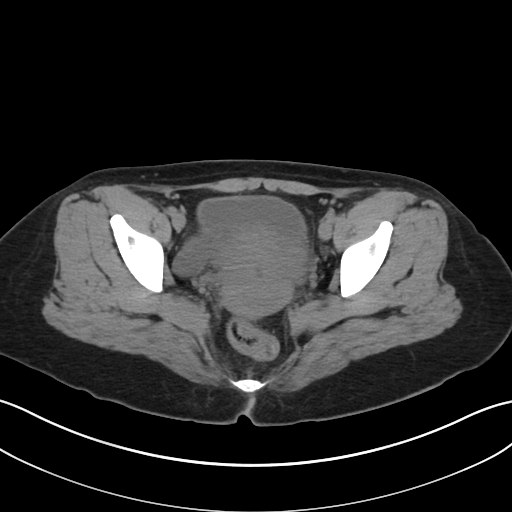
[im 26/91  soft-tissue]
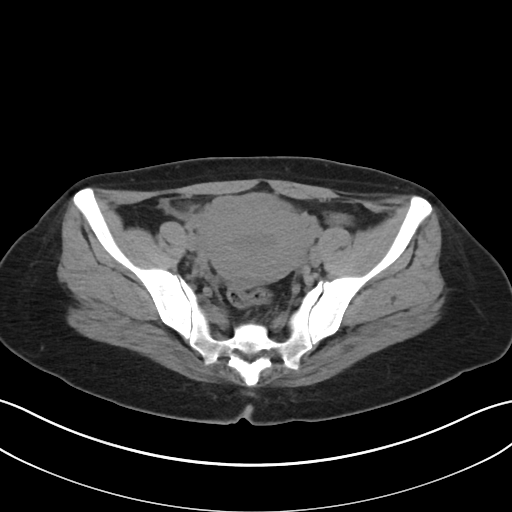
[im 33/91  soft-tissue]
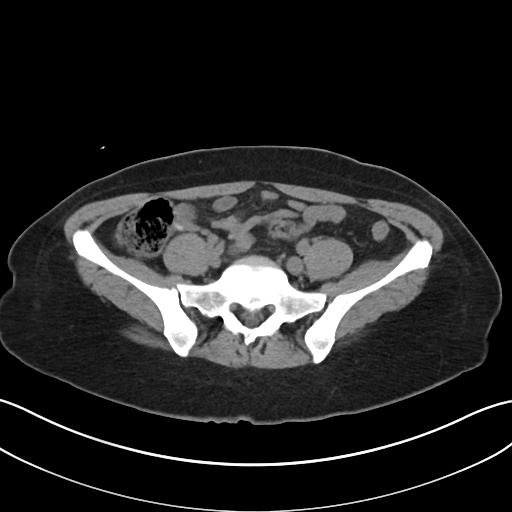
[im 40/91  soft-tissue]
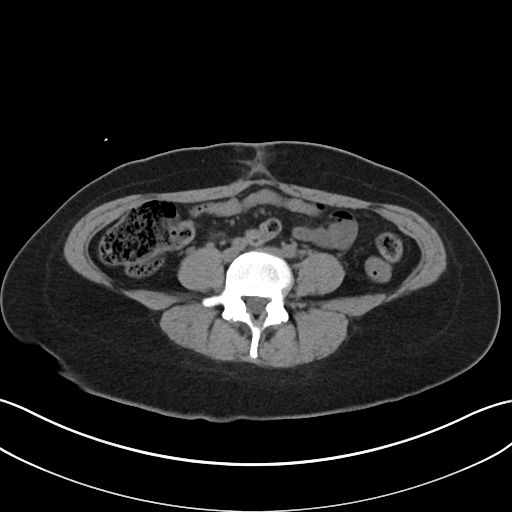
[im 47/91  soft-tissue]
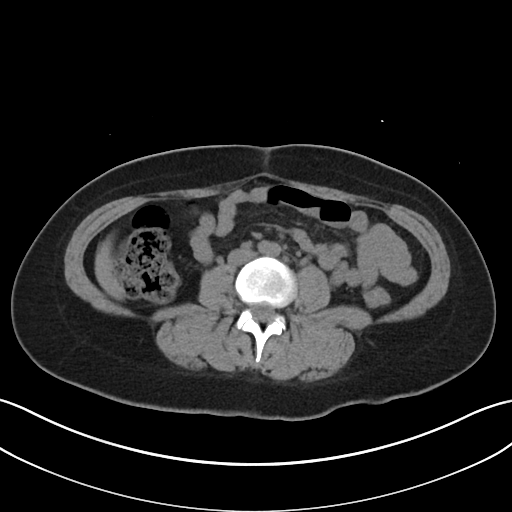
[im 51/91  soft-tissue]
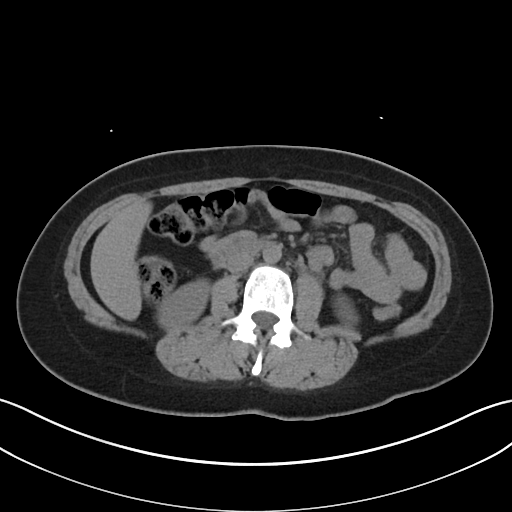
[im 58/91  soft-tissue]
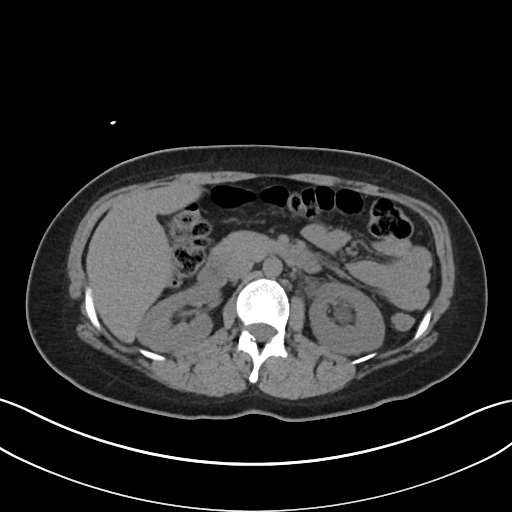
[im 58/91  bone]
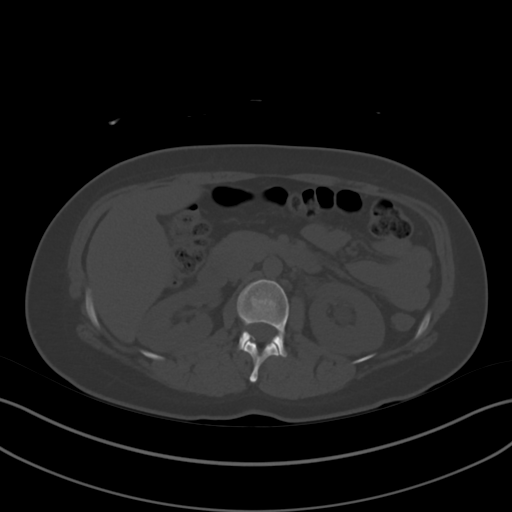
[im 65/91  soft-tissue]
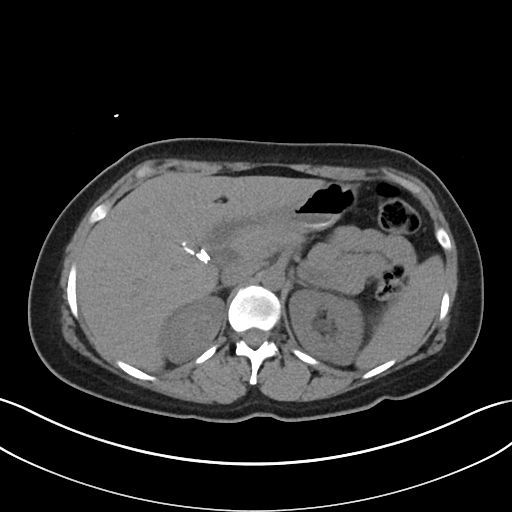
[im 73/91  soft-tissue]
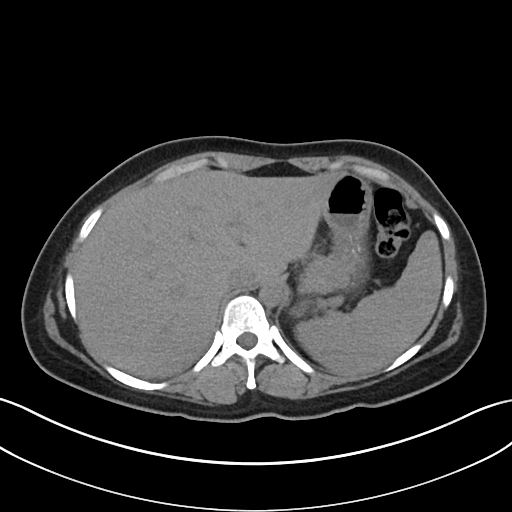
[im 80/91  soft-tissue]
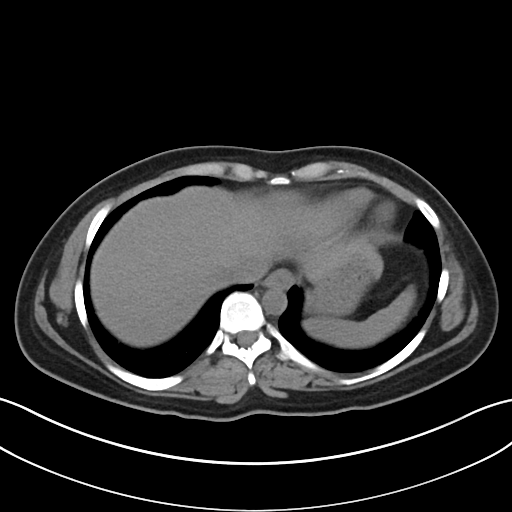
[im 87/91  soft-tissue]
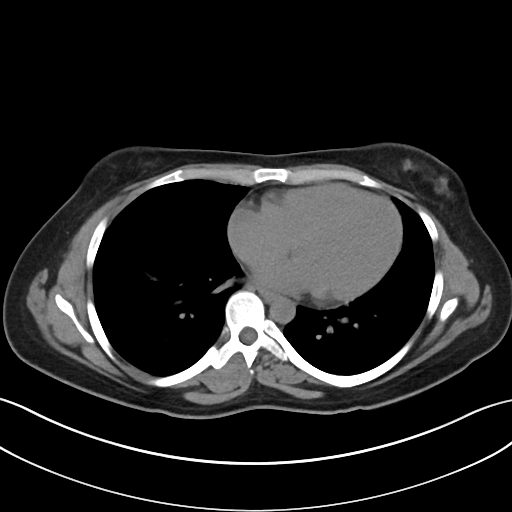

[Series 5: renal stone 3.0 cor · coronal · 0.79mm/px · 3 of 67 slices shown]
[im 23/67  soft-tissue]
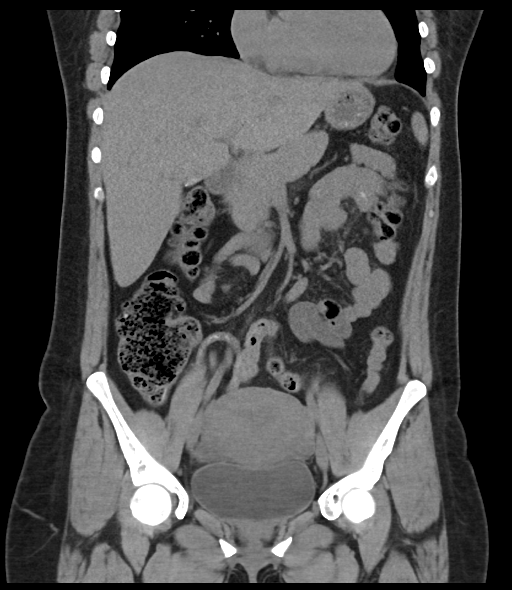
[im 30/67  soft-tissue]
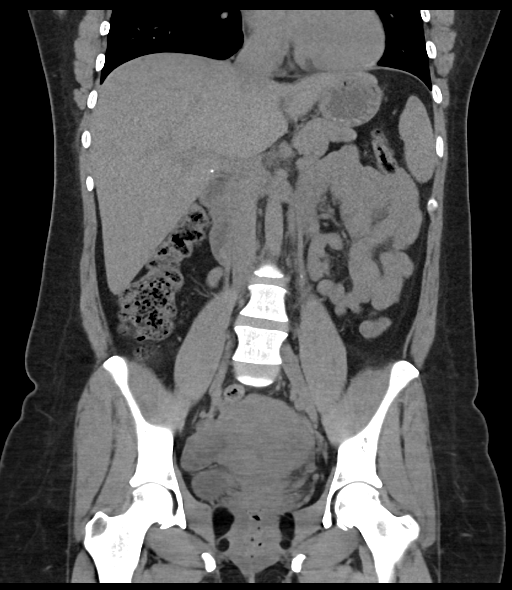
[im 37/67  soft-tissue]
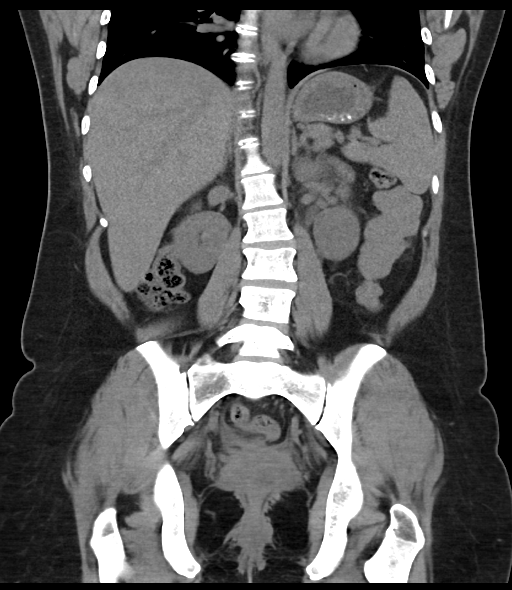

[16 of 46 positions shown; findings below may reference images not displayed]

FINDINGS: Lower chest: The lung bases appear clear. No pleural or pericardial
effusion.

Hepatobiliary: There is no suspicious liver abnormality identified.
Previous cholecystectomy. No intrahepatic bile duct dilatation.

Pancreas: Negative

Spleen: Negative

Adrenals/Urinary Tract: The adrenal glands are normal. Normal
appearance of the right kidney. There is left-sided perinephric fat
stranding and hydronephrosis. There is a small stone within the
proximal left ureter which measures 2-3 mm, image 43 of series 3.
The urinary bladder is normal.

Stomach/Bowel: The stomach is within normal limits. The small bowel
loops have a normal course and caliber. No obstruction. Normal
appearance of the colon. The appendix is visualized and appears
normal.

Vascular/Lymphatic: Normal appearance of the abdominal aorta. No
enlarged retroperitoneal or mesenteric adenopathy. No enlarged
pelvic or inguinal lymph nodes.

Reproductive: The uterus and adnexal structures are unremarkable.

Other: No free fluid or fluid collections within the abdomen or
pelvis.

Musculoskeletal: There is a mild lumbar scoliosis deformity which is
convex towards the left.
IMPRESSION: 1. Left-sided hydronephrosis secondary to 2-3 mm proximal left
ureteral calculus.

## 2017-10-31 ENCOUNTER — Ambulatory Visit: Payer: BLUE CROSS/BLUE SHIELD | Admitting: Physician Assistant

## 2017-10-31 ENCOUNTER — Encounter: Payer: Self-pay | Admitting: Physician Assistant

## 2017-10-31 VITALS — BP 140/90 | HR 77 | Temp 98.7°F | Ht 59.5 in | Wt 141.4 lb

## 2017-10-31 DIAGNOSIS — R41840 Attention and concentration deficit: Secondary | ICD-10-CM

## 2017-10-31 DIAGNOSIS — Z7689 Persons encountering health services in other specified circumstances: Secondary | ICD-10-CM

## 2017-10-31 DIAGNOSIS — R5383 Other fatigue: Secondary | ICD-10-CM | POA: Diagnosis not present

## 2017-10-31 LAB — CBC WITH DIFFERENTIAL/PLATELET
BASOS ABS: 0 10*3/uL (ref 0.0–0.1)
Basophils Relative: 0.3 % (ref 0.0–3.0)
Eosinophils Absolute: 0.1 10*3/uL (ref 0.0–0.7)
Eosinophils Relative: 0.9 % (ref 0.0–5.0)
HEMATOCRIT: 42 % (ref 36.0–46.0)
Hemoglobin: 14.3 g/dL (ref 12.0–15.0)
LYMPHS PCT: 26.5 % (ref 12.0–46.0)
Lymphs Abs: 1.8 10*3/uL (ref 0.7–4.0)
MCHC: 34 g/dL (ref 30.0–36.0)
MCV: 94.2 fl (ref 78.0–100.0)
MONOS PCT: 8.9 % (ref 3.0–12.0)
Monocytes Absolute: 0.6 10*3/uL (ref 0.1–1.0)
Neutro Abs: 4.2 10*3/uL (ref 1.4–7.7)
Neutrophils Relative %: 63.4 % (ref 43.0–77.0)
Platelets: 223 10*3/uL (ref 150.0–400.0)
RBC: 4.46 Mil/uL (ref 3.87–5.11)
RDW: 12.9 % (ref 11.5–15.5)
WBC: 6.6 10*3/uL (ref 4.0–10.5)

## 2017-10-31 LAB — COMPREHENSIVE METABOLIC PANEL
ALT: 13 U/L (ref 0–35)
AST: 13 U/L (ref 0–37)
Albumin: 4.6 g/dL (ref 3.5–5.2)
Alkaline Phosphatase: 45 U/L (ref 39–117)
BILIRUBIN TOTAL: 0.5 mg/dL (ref 0.2–1.2)
BUN: 16 mg/dL (ref 6–23)
CALCIUM: 9.5 mg/dL (ref 8.4–10.5)
CO2: 24 mEq/L (ref 19–32)
Chloride: 106 mEq/L (ref 96–112)
Creatinine, Ser: 0.68 mg/dL (ref 0.40–1.20)
GFR: 106.46 mL/min (ref >60.00)
GLUCOSE: 91 mg/dL (ref 70–99)
Potassium: 4.7 mEq/L (ref 3.5–5.1)
Sodium: 139 mEq/L (ref 135–145)
Total Protein: 7.3 g/dL (ref 6.0–8.3)

## 2017-10-31 LAB — POCT URINE PREGNANCY: Preg Test, Ur: NEGATIVE

## 2017-10-31 LAB — T4, FREE: FREE T4: 0.78 ng/dL (ref 0.60–1.60)

## 2017-10-31 LAB — VITAMIN B12: Vitamin B-12: 368 pg/mL (ref 211–911)

## 2017-10-31 LAB — TSH: TSH: 2.74 u[IU]/mL (ref 0.35–4.50)

## 2017-10-31 NOTE — Progress Notes (Signed)
Kelsey [name] is a 32 y.o. female here to Establish Care.  I acted as a Neurosurgeon for [employer], PA-C Corky Mull, LPN  History of Present Illness:   Chief Complaint  Patient presents with  . Establish Care    BC/BS  . Trouble concentrating    trouble getting words out x 2 weeks  . Dull Headache    Acute Concerns: Difficulty word finding and fatigue -- unable to do well with completing tasks, difficulty with word-finding, and multi-tasking x 1 year. Over the past 2 weeks things have worsened. Mom with early heart attack, and mom thought that perhaps prior to her MI she was also having issues concentrating. Patient reports that she is forgetting to do things such as: paying bills. Never had issues with ADHD or ADD while in high school. Also endorses a HA over the past two weeks, head feels like a "dull ache" and with "fullness" -- will take Aleve depending on how long it lasts and can help treat some. She has 3 kids: ages 90, 55 and 71. Doesn't sleep through the nights, falls asleep around midnight, wakes up every hour to two hours for various reasons, sometimes because of kids, cats, or no reason at all. Used to work in a medical office but left 4 years ago. Trying to eat healthy, reduced CHO recently. Exercises about 3-6 times a week. Drinks 1-2 cups of coffee daily. No changes in vision, weakness on one side of the body. Feels very hungry before periods. Has noticed that her acne has been worsening. Was going to an Ob-Gyn but doesn't go regularly.  Last couple of months has been dealing with anxiety.  GAD 7 : Generalized Anxiety Score 10/31/2017  Nervous, Anxious, on Edge 2  Control/stop worrying 1  Worry too much - different things 1  Trouble relaxing 0  Restless 0  Easily annoyed or irritable 2  Afraid - awful might happen 1  Total GAD 7 Score 7  Anxiety Difficulty Somewhat difficult    BP Readings from Last 3 Encounters:  10/31/17 140/90  08/19/15 128/86  05/15/15 122/78     Wt Readings from Last 4 Encounters:  10/31/17 141 lb 6.1 oz (64.1 kg)  05/13/15 154 lb 6.4 oz (70 kg)  03/08/14 156 lb 6.4 oz (70.9 kg)  04/22/13 154 lb 8 oz (70.1 kg)     Health Maintenance: Immunizations -- up to date Colonoscopy -- N/A Mammogram -- N/a PAP -- done 2016 Normal, No Hx abnormal paps Weight -- Weight: 141 lb 6.1 oz (64.1 kg)   Depression screen PHQ 2/9 10/31/2017  Decreased Interest 0  Down, Depressed, Hopeless 0  PHQ - 2 Score 0    GAD 7 : Generalized Anxiety Score 10/31/2017  Nervous, Anxious, on Edge 2  Control/stop worrying 1  Worry too much - different things 1  Trouble relaxing 0  Restless 0  Easily annoyed or irritable 2  Afraid - awful might happen 1  Total GAD 7 Score 7  Anxiety Difficulty Somewhat difficult     Other providers/specialists: None  Past Medical History:  Diagnosis Date  . History of chicken pox   . Kidney stone    2017  . Medical history non-contributory   . Scoliosis    no treatment required     Social History   Socioeconomic History  . Marital status: Married    Spouse name: Not on file  . Number of children: Not on file  . Years of education: Not  on file  . Highest education level: Not on file  Occupational History  . Not on file  Social Needs  . Financial resource strain: Not on file  . Food insecurity:    Worry: Not on file    Inability: Not on file  . Transportation needs:    Medical: Not on file    Non-medical: Not on file  Tobacco Use  . Smoking status: Former Smoker    Types: Cigarettes    Last attempt to quit: 08/13/2000    Years since quitting: 17.2  . Smokeless tobacco: Never Used  Substance and Sexual Activity  . Alcohol use: Yes    Comment: socially 3 x's a month  . Drug use: No  . Sexual activity: Yes    Birth control/protection: Surgical  Lifestyle  . Physical activity:    Days per week: Not on file    Minutes per session: Not on file  . Stress: Not on file  Relationships  .  Social connections:    Talks on phone: Not on file    Gets together: Not on file    Attends religious service: Not on file    Active member of club or organization: Not on file    Attends meetings of clubs or organizations: Not on file    Relationship status: Not on file  . Intimate partner violence:    Fear of current or ex partner: Not on file    Emotionally abused: Not on file    Physically abused: Not on file    Forced sexual activity: Not on file  Other Topics Concern  . 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 CHOLECYSTECTOMY  2011  . TUBAL LIGATION Bilateral 05/14/2015   Procedure: POST PARTUM TUBAL LIGATION;  Surgeon: Gerald Leitz, MD;  Location: [facility];  Service: Gynecology;  Laterality: Bilateral;    History reviewed. No pertinent family history.  No Known Allergies   Current Medications:   Current Outpatient Medications:  .  naproxen sodium (ALEVE) 220 MG tablet, Take 220 mg by mouth as needed., Disp: , Rfl:    Review of Systems:   ROS  Negative unless otherwise specified per HPI.   Vitals:   Vitals:   10/31/17 1109  BP: 140/90  Pulse: 77  Temp: 98.7 F (37.1 C)  TempSrc: Oral  SpO2: 98%  Weight: 141 lb 6.1 oz (64.1 kg)  Height: 4' 11.5" (1.511 m)     Body mass index is 28.08 kg/m.  Physical Exam:   Physical Exam  Constitutional: She appears well-developed. She is cooperative.  Non-toxic appearance. She does not have a sickly appearance. She does not appear ill. No distress.  Cardiovascular: Normal rate, regular rhythm, S1 normal, S2 normal, normal heart sounds and normal pulses.  No LE edema  Pulmonary/Chest: Effort normal and breath sounds normal.  Neurological: She is alert. She has normal strength. No cranial nerve deficit or sensory deficit. Coordination and gait normal. GCS eye subscore is 4. GCS verbal subscore is 5. GCS motor subscore is 6.  Reflex  Scores:      Patellar reflexes are 2+ on the right side and 2+ on the left side. Skin: Skin is warm, dry and intact.  Psychiatric: She has a normal mood and affect. Her speech is normal and behavior is normal.  Nursing note and vitals reviewed.   Assessment and Plan:  Cloria SpringCarissa was seen today for establish care, trouble concentrating and dull headache.  Diagnoses and all orders for this visit:  Difficulty concentrating and Fatigue, unspecified type No red flags on exam. Neuro exam benign. Briefly discussed patient with Dr. Helane RimaErica Wallace. Will check baseline labs. Offered reassurance to patient. Blood pressure slightly elevated in office today, recommended re-check in two weeks. Will also follow-up on her symptoms at that time. Suspect issues are multifactorial, including fatigue and anxiety. Reviewed red flags with patient and recommended that she seek medical care should she develop any stroke-like symptoms. Patient verbalized understanding and is agreeable to plan. -     CBC with Differential/Platelet -     Comprehensive metabolic panel -     Vitamin B12 -     TSH -     T4, free -     POCT urine pregnancy  Encounter to establish care    . Reviewed expectations re: course of current medical issues. . Discussed self-management of symptoms. . Outlined signs and symptoms indicating need for more acute intervention. . Patient verbalized understanding and all questions were answered. . See orders for this visit as documented in the electronic medical record. . Patient received an After-Visit Summary.   CMA or LPN served as scribe during this visit. History, Physical, and Plan performed by medical provider. Documentation and orders reviewed and attested to.   Jarold MottoSamantha Masa Lubin, PA-C

## 2017-10-31 NOTE — Patient Instructions (Addendum)
It was great to meet you!  I will contact you regarding your lab results.  For hormone testing, I recommend that you consider talking to your ob-gyn.  If you develop any stroke-like symptoms including severe sudden change in headaches, changes in vision, one-sided weakness or other concerns --> please go to the ER.

## 2017-11-14 ENCOUNTER — Ambulatory Visit (INDEPENDENT_AMBULATORY_CARE_PROVIDER_SITE_OTHER): Payer: BLUE CROSS/BLUE SHIELD | Admitting: Physician Assistant

## 2017-11-14 ENCOUNTER — Encounter: Payer: Self-pay | Admitting: Physician Assistant

## 2017-11-14 VITALS — BP 136/88 | HR 74 | Temp 98.1°F | Ht 59.0 in | Wt 140.0 lb

## 2017-11-14 DIAGNOSIS — G44219 Episodic tension-type headache, not intractable: Secondary | ICD-10-CM | POA: Diagnosis not present

## 2017-11-14 DIAGNOSIS — R41840 Attention and concentration deficit: Secondary | ICD-10-CM

## 2017-11-14 NOTE — Progress Notes (Signed)
Kelsey [name] is a 32 y.o. female is here to discuss: follow-up of HA and difficulty concentrating  History of Present Illness:   Chief Complaint  Patient presents with  . Headache    HPI   She is here to follow-up on her headaches and difficulty concentrating. She reports that she has last seen me she has had inconsistent headache. She says that they are very dull. Only one time since she last saw me she required medication, and it resolved with Aleve. She denies chest pain, shortness of breath, changes in vision, weakness on one side of her body, changes in balance. She is still working on trying to get better sleep. She has recently taken the cat out of her bedroom to try to get some better sleep, and felt that this is helping. She has an appointment to see her OB/GYN next week to talk about possible hormonal changes. She states that she only has difficulty concentrating when she has had a difficult time with her attention is instructed on the dull headache. She thinks that her headache may be related to timing of her menstrual cycle.   We reviewed her labs from last visit, all were normal.    BP Readings from Last 3 Encounters:  11/14/17 136/88  10/31/17 140/90  08/19/15 128/86     There are no preventive care reminders to display for this patient.  Past Medical History:  Diagnosis Date  . History of chicken pox   . Kidney stone    2017  . Medical history non-contributory   . Scoliosis    no treatment required     Social History   Socioeconomic History  . Marital status: Married    Spouse name: Not on file  . Number of children: Not on file  . Years of education: Not on file  . Highest education level: Not on file  Occupational History  . Not on file  Social Needs  . Financial resource strain: Not on file  . Food insecurity:    Worry: Not on file    Inability: Not on file  . Transportation needs:    Medical: Not on file    Non-medical: Not on file    Tobacco Use  . Smoking status: Former Smoker    Types: Cigarettes    Last attempt to quit: 08/13/2000    Years since quitting: 17.2  . Smokeless tobacco: Never Used  Substance and Sexual Activity  . Alcohol use: Yes    Comment: socially 3 x's a month  . Drug use: No  . Sexual activity: Yes    Birth control/protection: Surgical  Lifestyle  . Physical activity:    Days per week: Not on file    Minutes per session: Not on file  . Stress: Not on file  Relationships  . Social connections:    Talks on phone: Not on file    Gets together: Not on file    Attends religious service: Not on file    Active member of club or organization: Not on file    Attends meetings of clubs or organizations: Not on file    Relationship status: Not on file  . Intimate partner violence:    Fear of current or ex partner: Not on file    Emotionally abused: Not on file    Physically abused: Not on file    Forced sexual activity: Not on file  Other Topics Concern  . 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 CHOLECYSTECTOMY  2011  . TUBAL LIGATION Bilateral 05/14/2015   Procedure: POST PARTUM TUBAL LIGATION;  Surgeon: Gerald Leitzara Cole, MD;  Location: [facility];  Service: Gynecology;  Laterality: Bilateral;    No family history on file.  PMHx, SurgHx, SocialHx, FamHx, Medications, and Allergies were reviewed in the Visit Navigator and updated as appropriate.   Patient Active Problem List   Diagnosis Date Noted  . Late prenatal care 05/13/2015  . SGA (small for gestational age)--5%ile at 39 weeks. 05/13/2015  . Vaginal delivery 05/13/2015    Social History   Tobacco Use  . Smoking status: Former Smoker    Types: Cigarettes    Last attempt to quit: 08/13/2000    Years since quitting: 17.2  . Smokeless tobacco: Never Used  Substance Use Topics  . Alcohol use: Yes    Comment: socially 3 x's a month  . Drug use: No     Current Medications and Allergies:    Current Outpatient Medications:  .  naproxen sodium (ALEVE) 220 MG tablet, Take 220 mg by mouth as needed., Disp: , Rfl:   No Known Allergies  Review of Systems   ROS  Negative unless otherwise specified per HPI.   Vitals:   Vitals:   11/14/17 0945  BP: 136/88  Pulse: 74  Temp: 98.1 F (36.7 C)  SpO2: 98%  Weight: 140 lb (63.5 kg)  Height: 4\' 11"  (1.499 m)     Body mass index is 28.28 kg/m.   Physical Exam:    Physical Exam  Constitutional: She appears well-developed. She is cooperative.  Non-toxic appearance. She does not have a sickly appearance. She does not appear ill. No distress.  Cardiovascular: Normal rate, regular rhythm, S1 normal, S2 normal, normal heart sounds and normal pulses.  No LE edema  Pulmonary/Chest: Effort normal and breath sounds normal.  Neurological: She is alert. Coordination and gait normal. GCS eye subscore is 4. GCS verbal subscore is 5. GCS motor subscore is 6.  Skin: Skin is warm, dry and intact.  Psychiatric: She has a normal mood and affect. Her speech is normal and behavior is normal.  Nursing note and vitals reviewed.     Assessment and Plan:    Cloria SpringCarissa was seen today for headache.  Diagnoses and all orders for this visit:  Difficulty concentrating  Episodic tension-type headache, not intractable   We discussed options for care. We discussed possibly starting a prescription medication for her headaches as needed, however because they're so inconsistent, and they are being relieved with ibuprofen, we agree that this is not warranted at this time. We also discussed the possibility of getting imaging, however given that she does not have any neurological deficit or any other red flags on exam today, we decided to defer this as well. We also discussed the possibility of going to [facility] for possible evaluation of ADD, however we deferred this as well. She is going  to see her OB/GYN next week for evaluation of her hormones, possibility to discuss starting contraceptives to help with acne and her headaches potentially. I did give her a headache log that she can use to evaluate any possible triggers that she may be having. Additionally I gave her a blood pressure log for her to keep track of her blood pressure, I recommended that she check this when she is near a machine and anytime she is  having headaches. I advised her to follow-up with [place] in 3 months, sooner if needed.  Patient verbalized understanding and agreement to plan.  . Reviewed expectations re: course of current medical issues. . Discussed self-management of symptoms. . Outlined signs and symptoms indicating need for more acute intervention. . Patient verbalized understanding and all questions were answered. . See orders for this visit as documented in the electronic medical record. . Patient received an After Visit Summary.  Jarold Motto, PA-C [place] 11/14/2017  Follow-up: No follow-ups on file.

## 2017-11-14 NOTE — Patient Instructions (Signed)
Let's follow-up in 3 months, sooner if your headaches become more worrisome or persistent.

## 2018-02-17 ENCOUNTER — Encounter: Payer: Self-pay | Admitting: Physician Assistant

## 2018-02-17 ENCOUNTER — Ambulatory Visit (INDEPENDENT_AMBULATORY_CARE_PROVIDER_SITE_OTHER): Payer: BLUE CROSS/BLUE SHIELD | Admitting: Physician Assistant

## 2018-02-17 VITALS — BP 136/82 | HR 92 | Temp 98.6°F | Wt 144.6 lb

## 2018-02-17 DIAGNOSIS — G44219 Episodic tension-type headache, not intractable: Secondary | ICD-10-CM | POA: Diagnosis not present

## 2018-02-17 DIAGNOSIS — R5383 Other fatigue: Secondary | ICD-10-CM

## 2018-02-17 NOTE — Progress Notes (Signed)
Kelsey FreudCarissa [name] is a 32 y.o. female is here to follow up on Headaches.   History of Present Illness:   Chief Complaint  Patient presents with  . Follow-up    Head ache     HPI She is here to follow-up on headaches, difficulty concentrating and blood pressure. She started a oral birth control after seeing her gynecologist on 11/26/17. She is tolerating this well. States that she is also being more intentional about getting more sleep and improving her sleep hygiene. States that since she has started her birth control her headaches have improved. She has not been checking her blood pressure, states that she was told that it was normal at last ob-gyn visit. She denies chest pain, SOB, changes in vision, excessive ibuprofen use.  BP Readings from Last 3 Encounters:  02/17/18 136/82  11/14/17 136/88  10/31/17 140/90     There are no preventive care reminders to display for this patient.  Past Medical History:  Diagnosis Date  . History of chicken pox   . Kidney stone    2017  . Medical history non-contributory   . Scoliosis    no treatment required     Social History   Socioeconomic History  . Marital status: Married    Spouse name: Not on file  . Number of children: Not on file  . Years of education: Not on file  . Highest education level: Not on file  Occupational History  . Not on file  Social Needs  . Financial resource strain: Not on file  . Food insecurity:    Worry: Not on file    Inability: Not on file  . Transportation needs:    Medical: Not on file    Non-medical: Not on file  Tobacco Use  . Smoking status: Former Smoker    Types: Cigarettes    Last attempt to quit: 08/13/2000    Years since quitting: 17.5  . Smokeless tobacco: Never Used  Substance and Sexual Activity  . Alcohol use: Yes    Comment: socially 3 x's a month  . Drug use: No  . Sexual activity: Yes    Birth control/protection: Surgical  Lifestyle  . Physical activity:    Days per  week: Not on file    Minutes per session: Not on file  . Stress: Not on file  Relationships  . Social connections:    Talks on phone: Not on file    Gets together: Not on file    Attends religious service: Not on file    Active member of club or organization: Not on file    Attends meetings of clubs or organizations: Not on file    Relationship status: Not on file  . Intimate partner violence:    Fear of current or ex partner: Not on file    Emotionally abused: Not on file    Physically abused: Not on file    Forced sexual activity: Not on file  Other Topics Concern  . 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 CHOLECYSTECTOMY  2011  . TUBAL LIGATION Bilateral 05/14/2015   Procedure: POST PARTUM TUBAL LIGATION;  Surgeon: Gerald Leitzara Cole, MD;  Location: [facility];  Service: Gynecology;  Laterality: Bilateral;    No family history on file.  PMHx, SurgHx, SocialHx, FamHx, Medications, and Allergies were reviewed in the Visit Navigator and updated as  appropriate.   Patient Active Problem List   Diagnosis Date Noted  . Late prenatal care 05/13/2015  . SGA (small for gestational age)--5%ile at 39 weeks. 05/13/2015  . Vaginal delivery 05/13/2015    Social History   Tobacco Use  . Smoking status: Former Smoker    Types: Cigarettes    Last attempt to quit: 08/13/2000    Years since quitting: 17.5  . Smokeless tobacco: Never Used  Substance Use Topics  . Alcohol use: Yes    Comment: socially 3 x's a month  . Drug use: No    Current Medications and Allergies:    Current Outpatient Medications:  .  naproxen sodium (ALEVE) 220 MG tablet, Take 220 mg by mouth as needed., Disp: , Rfl:  .  Norethin-Eth Estrad-Fe Biphas (LO LOESTRIN FE PO), Take by mouth., Disp: , Rfl:   No Known Allergies  Review of Systems   Review of Systems  Negative unless otherwise specified per HPI.  Vitals:   Vitals:    02/17/18 1116  BP: 136/82  Pulse: 92  Temp: 98.6 F (37 C)  TempSrc: Oral  SpO2: 98%  Weight: 144 lb 9.6 oz (65.6 kg)     Body mass index is 29.21 kg/m.   Physical Exam:    Physical Exam  Constitutional: She appears well-developed. She is cooperative.  Non-toxic appearance. She does not have a sickly appearance. She does not appear ill. No distress.  Cardiovascular: Normal rate, regular rhythm, S1 normal, S2 normal, normal heart sounds and normal pulses.  No LE edema  Pulmonary/Chest: Effort normal and breath sounds normal.  Neurological: She is alert. GCS eye subscore is 4. GCS verbal subscore is 5. GCS motor subscore is 6.  Skin: Skin is warm, dry and intact.  Psychiatric: She has a normal mood and affect. Her speech is normal and behavior is normal.  Nursing note and vitals reviewed.    Assessment and Plan:    Evva was seen today for follow-up.  Diagnoses and all orders for this visit:  Episodic tension-type headache, not intractable  Fatigue, unspecified type   Symptoms significantly improved with rest and initiation of OCPs. She is going to follow-up with her ob-gyn in 1 month to check in to make sure she is happy with her birth control. Blood pressure remains <140/90 -- continue to monitor at next visit. Patient to follow-up if symptoms worsen or persist.  . Reviewed expectations re: course of current medical issues. . Discussed self-management of symptoms. . Outlined signs and symptoms indicating need for more acute intervention. . Patient verbalized understanding and all questions were answered. . See orders for this visit as documented in the electronic medical record. . Patient received an After Visit Summary.   Jarold Motto, PA-C [place] 02/17/2018  Follow-up: No follow-ups on file.

## 2021-05-09 ENCOUNTER — Encounter (INDEPENDENT_AMBULATORY_CARE_PROVIDER_SITE_OTHER): Payer: BLUE CROSS/BLUE SHIELD | Admitting: Ophthalmology

## 2022-05-06 ENCOUNTER — Encounter: Payer: Self-pay | Admitting: *Deleted

## 2024-02-23 ENCOUNTER — Encounter (HOSPITAL_BASED_OUTPATIENT_CLINIC_OR_DEPARTMENT_OTHER): Payer: Self-pay | Admitting: *Deleted

## 2024-02-23 ENCOUNTER — Emergency Department (HOSPITAL_BASED_OUTPATIENT_CLINIC_OR_DEPARTMENT_OTHER): Admission: EM | Admit: 2024-02-23 | Discharge: 2024-02-23 | Disposition: A | Discharge status: Home / Self Care

## 2024-02-23 ENCOUNTER — Other Ambulatory Visit: Payer: Self-pay

## 2024-02-23 ENCOUNTER — Emergency Department (HOSPITAL_BASED_OUTPATIENT_CLINIC_OR_DEPARTMENT_OTHER)

## 2024-02-23 DIAGNOSIS — D259 Leiomyoma of uterus, unspecified: Secondary | ICD-10-CM | POA: Diagnosis not present

## 2024-02-23 DIAGNOSIS — D219 Benign neoplasm of connective and other soft tissue, unspecified: Secondary | ICD-10-CM

## 2024-02-23 DIAGNOSIS — D500 Iron deficiency anemia secondary to blood loss (chronic): Secondary | ICD-10-CM | POA: Diagnosis not present

## 2024-02-23 DIAGNOSIS — R1030 Lower abdominal pain, unspecified: Secondary | ICD-10-CM | POA: Diagnosis present

## 2024-02-23 LAB — URINALYSIS, ROUTINE W REFLEX MICROSCOPIC
Bilirubin Urine: NEGATIVE
Glucose, UA: NEGATIVE mg/dL
Hgb urine dipstick: NEGATIVE
Ketones, ur: NEGATIVE mg/dL
Leukocytes,Ua: NEGATIVE
Nitrite: NEGATIVE
Protein, ur: NEGATIVE mg/dL
Specific Gravity, Urine: 1.027 (ref 1.005–1.030)
pH: 5.5 (ref 5.0–8.0)

## 2024-02-23 LAB — CBC
HCT: 29.3 % — ABNORMAL LOW (ref 36.0–46.0)
Hemoglobin: 8.2 g/dL — ABNORMAL LOW (ref 12.0–15.0)
MCH: 20.3 pg — ABNORMAL LOW (ref 26.0–34.0)
MCHC: 28 g/dL — ABNORMAL LOW (ref 30.0–36.0)
MCV: 72.5 fL — ABNORMAL LOW (ref 80.0–100.0)
Platelets: 256 K/uL (ref 150–400)
RBC: 4.04 MIL/uL (ref 3.87–5.11)
RDW: 18.5 % — ABNORMAL HIGH (ref 11.5–15.5)
WBC: 7.2 K/uL (ref 4.0–10.5)
nRBC: 0 % (ref 0.0–0.2)

## 2024-02-23 LAB — COMPREHENSIVE METABOLIC PANEL WITH GFR
ALT: 12 U/L (ref 0–44)
AST: 14 U/L — ABNORMAL LOW (ref 15–41)
Albumin: 4.4 g/dL (ref 3.5–5.0)
Alkaline Phosphatase: 73 U/L (ref 38–126)
Anion gap: 11 (ref 5–15)
BUN: 10 mg/dL (ref 6–20)
CO2: 23 mmol/L (ref 22–32)
Calcium: 9.3 mg/dL (ref 8.9–10.3)
Chloride: 105 mmol/L (ref 98–111)
Creatinine, Ser: 0.65 mg/dL (ref 0.44–1.00)
GFR, Estimated: >60 mL/min (ref >60)
Glucose, Bld: 97 mg/dL (ref 70–99)
Potassium: 4.4 mmol/L (ref 3.5–5.1)
Sodium: 139 mmol/L (ref 135–145)
Total Bilirubin: 0.3 mg/dL (ref 0.0–1.2)
Total Protein: 7.1 g/dL (ref 6.5–8.1)

## 2024-02-23 LAB — LIPASE, BLOOD: Lipase: 31 U/L (ref 11–51)

## 2024-02-23 MED ORDER — FERROUS SULFATE 325 (65 FE) MG PO TABS: 325.0000 mg | ORAL_TABLET | Freq: Every day | ORAL | 3 refills | Status: AC

## 2024-02-23 NOTE — ED Notes (Signed)
 Pt d/c instructions, medications, and follow-up care reviewed with pt. Pt verbalized understanding and had no further questions at time of d/c. Pt CA&Ox4, ambulatory, and in NAD at time of d/c

## 2024-02-23 NOTE — ED Triage Notes (Addendum)
 Patient to ED reporting a rapidly growing uterine mass over the past 2 months. Increasing pain in LLQ and irregularities in her periods becoming more frequent. Intermittent nausea.   Urine pregnancy at [facility] today was negative.

## 2024-02-23 NOTE — Progress Notes (Signed)
 Kelsey [name] is a 38 y.o. female pt states she hasn't been able to make an apt with a doctor , and her fibroids are making her nausea x 8 yrs

## 2024-02-23 NOTE — Discharge Instructions (Addendum)
 As we discussed your ultrasound showed some large fibroids, you were also quite anemic, hemoglobin around 8.2. Please try to take the iron supplement that I provided, it can cause some stomach upset, constipation, stomach cramps, if you are not able to tolerate taking it daily I would at least try to take it every other day.  Additionally you can eat dark green leafy vegetables, red meat, cook with cast iron or using iron fish like we were discussing, these are other ways to supplement iron in your diet.  Please follow-up closely with OB/GYN, if you have significant worsening pain, lightheadedness, or severely worsening menses you may want to return for further evaluation.

## 2024-02-23 NOTE — ED Provider Notes (Signed)
 [place] EMERGENCY DEPARTMENT AT [facility] Provider Note   CSN: [id number] Arrival date & time: 02/23/24  1507     Patient presents with: Abdominal Pain   Kelsey [name] is a 38 y.o. female with past medical history significant for previous kidney stone, otherwise overall noncontributory who presents concern for rapidly growing mass in the uterus location over the last 2 months.  She does report history of known fibroids, but was surprised at the amount that it is grown.  She does endorse heavy menstrual cycle.  She reports some generalized fatigue.  She denies any nausea, vomiting, fever, chills.  She denies any dysuria.  No vaginal bleeding, discharge noted today.    Abdominal Pain      Prior to Admission medications   Medication Sig Start Date End Date Taking? Authorizing Provider  ferrous sulfate  325 (65 FE) MG tablet Take 1 tablet (325 mg total) by mouth daily. 02/23/24  Yes Clover Feehan H, PA-C  naproxen sodium (ALEVE) 220 MG tablet Take 220 mg by mouth as needed.    [provider]  Norethin-Eth Estrad-Fe Biphas (LO LOESTRIN FE PO) Take by mouth.    [provider]    Allergies: Patient has no known allergies.    Review of Systems  Gastrointestinal:  Positive for abdominal pain.  All other systems reviewed and are negative.   Updated Vital Signs BP 137/80   Pulse 86   Temp 98.2 F (36.8 C) (Oral)   Resp 16   SpO2 100%   Physical Exam Vitals and nursing note reviewed.  Constitutional:      General: She is not in acute distress.    Appearance: Normal appearance.  HENT:     Head: Normocephalic and atraumatic.  Eyes:     General:        Right eye: No discharge.        Left eye: No discharge.  Cardiovascular:     Rate and Rhythm: Normal rate and regular rhythm.     Heart sounds: No murmur heard.    No friction rub. No gallop.  Pulmonary:     Effort: Pulmonary effort is normal.     Breath sounds: Normal breath sounds.   Abdominal:     General: Bowel sounds are normal.     Palpations: Abdomen is soft.     Comments: Focal tenderness to palpation in the suprapubic region, palpable fibroid uterus suspected, no significant adnexal tenderness.  Skin:    General: Skin is warm and dry.     Capillary Refill: Capillary refill takes less than 2 seconds.  Neurological:     Mental Status: She is alert and oriented to person, place, and time.  Psychiatric:        Mood and Affect: Mood normal.        Behavior: Behavior normal.     (all labs ordered are listed, but only abnormal results are displayed) Labs Reviewed  COMPREHENSIVE METABOLIC PANEL WITH GFR - Abnormal; Notable for the following components:      Result Value   AST 14 (*)    All other components within normal limits  CBC - Abnormal; Notable for the following components:   Hemoglobin 8.2 (*)    HCT 29.3 (*)    MCV 72.5 (*)    MCH 20.3 (*)    MCHC 28.0 (*)    RDW 18.5 (*)    All other components within normal limits  LIPASE, BLOOD  URINALYSIS, ROUTINE W REFLEX MICROSCOPIC  EKG: None  Radiology: US  PELVIC COMPLETE W TRANSVAGINAL AND TORSION R/O Result Date: 02/23/2024 CLINICAL DATA:  [id number] Pelvic pain [id number] EXAM: TRANSABDOMINAL AND TRANSVAGINAL ULTRASOUND OF PELVIS DOPPLER ULTRASOUND OF OVARIES TECHNIQUE: Both transabdominal and transvaginal ultrasound examinations of the pelvis were performed. Transabdominal technique was performed for global imaging of the pelvis including uterus, ovaries, adnexal regions, and pelvic cul-de-sac. It was necessary to proceed with endovaginal exam following the transabdominal exam to visualize the uterus and ovaries. Color and duplex Doppler ultrasound was utilized to evaluate blood flow to the ovaries. COMPARISON:  August 19, 2015 FINDINGS: Uterus Measurements: 12.4 x 8 x 9.8 cm = volume: 511 mL. Large fibroid in the fundus/upper uterine body, measuring 6.9 x 6.4 x 6.9 cm. Smaller, mass along the posterior  uterine body, measuring 4.7 x 2.3 x 3.5 cm, possibly a subserosal fibroid. Endometrium Not well visualized or evaluated due to the large uterine fibroid. Right ovary Measures 3.8 x 2.5 x 2.4 cm = volume: 12.1 mL. Normal appearance/no adnexal mass. Left ovary Measurements: 3.9 x 1.9 x 3.1 cm = volume: 12.1 mL. Normal appearance/no adnexal mass. Pulsed Doppler evaluation of the ovaries was nondiagnostic due to limited acoustic windows from overlying bowel gas and high ovarian positioning. Other findings No free pelvic fluid. IMPRESSION: 1. Large uterine fibroid in the fundus/upper uterine body, measuring 6.9 x 6.4 x 6.9 cm. A second smaller mass along the posterior uterine body, measuring 4.7 x 2.3 x 3.5 cm, possibly a subserosal fibroid. 2. The endometrium was not well visualized or evaluated due to the large fibroid. 3. Nonenlarged ovaries. Evaluation for ovarian torsion with spectral doppler imaging was nondiagnostic due to limited acoustic windows from overlying bowel gas and high ovarian positioning. Electronically Signed   By: Rogelia Myers M.D.   On: 02/23/2024 20:58     Procedures   Medications Ordered in the ED - No data to display                                  Medical Decision Making Amount and/or Complexity of Data Reviewed Labs: ordered. Radiology: ordered.  Risk OTC drugs.   This patient is a 38 y.o. female  who presents to the ED for concern of lower abdominal pain, more frequent periods.   Differential diagnoses prior to evaluation: The emergent differential diagnosis includes, but is not limited to,  The causes of generalized abdominal pain include but are not limited to AAA, mesenteric ischemia, appendicitis, diverticulitis, DKA, gastritis, gastroenteritis, AMI, nephrolithiasis, pancreatitis, peritonitis, adrenal insufficiency,lead poisoning, iron toxicity, intestinal ischemia, constipation, UTI,SBO/LBO, splenic rupture, biliary disease, IBD, IBS, PUD, or hepatitis, Ectopic  pregnancy, ovarian torsion, PID--some suspicion given her history and exam of progressive fibroid disease, this is not an exhaustive differential.   Past Medical History / Co-morbidities / Social History: Fibroids, previous kidney stones  Physical Exam: Physical exam performed. The pertinent findings include: Focal tenderness to palpation in the suprapubic region, palpable fibroid uterus suspected, no significant adnexal tenderness.   Mild hypertension on arrival, blood pressure 159/108, vital signs otherwise stable throughout.  Lab Tests/Imaging studies: I personally interpreted labs/imaging and the pertinent results include: CBC notable for anemia, hemoglobin 9.2, CMP overall unremarkable, normal lipase 31, UA unremarkable.  I independently interpreted pelvic ultrasound which shows large fibroid as well as suspected subserosal fibroid.  No clear visualization to clearly rule out ovarian torsion, but patient does not have adnexal tenderness, pain has been  present for quite some time, overall very low clinical suspicion that this is an ovarian torsion presentation.  Suspect pain related to fibroid uterus. I agree with the radiologist interpretation.   Medications: Encouraged OB/GYN follow-up, iron supplementation for her anemia  Disposition: After consideration of the diagnostic results and the patients response to treatment, I feel that patient stable for discharge with plan as above.   emergency department workup does not suggest an emergent condition requiring admission or immediate intervention beyond what has been performed at this time. The plan is: as above. The patient is safe for discharge and has been instructed to return immediately for worsening symptoms, change in symptoms or any other concerns.   Final diagnoses:  Iron deficiency anemia due to chronic blood loss  Fibroids    ED Discharge Orders          Ordered    ferrous sulfate  325 (65 FE) MG tablet  Daily         02/23/24 2112               Rosan Sherlean DEL, PA-C 02/23/24 2129    Neysa Caron PARAS, DO 02/23/24 2344
